# Patient Record
Sex: Male | Born: 1935 | Race: White | Hispanic: No | Marital: Married | Smoking: Never smoker
Health system: Southern US, Community
[De-identification: ages and names within clinical notes are randomized; demographics above are authoritative.]

## PROBLEM LIST (undated history)

## (undated) DIAGNOSIS — E7800 Pure hypercholesterolemia, unspecified: Secondary | ICD-10-CM

## (undated) DIAGNOSIS — I080 Rheumatic disorders of both mitral and aortic valves: Secondary | ICD-10-CM

## (undated) DIAGNOSIS — K432 Incisional hernia without obstruction or gangrene: Secondary | ICD-10-CM

## (undated) DIAGNOSIS — I82409 Acute embolism and thrombosis of unspecified deep veins of unspecified lower extremity: Secondary | ICD-10-CM

## (undated) DIAGNOSIS — Q6100 Congenital renal cyst, unspecified: Secondary | ICD-10-CM

## (undated) DIAGNOSIS — C159 Malignant neoplasm of esophagus, unspecified: Secondary | ICD-10-CM

## (undated) DIAGNOSIS — F0390 Unspecified dementia without behavioral disturbance: Secondary | ICD-10-CM

## (undated) HISTORY — PX: REVISION TOTAL KNEE ARTHROPLASTY: SHX767

## (undated) HISTORY — DX: Malignant neoplasm of esophagus, unspecified: C15.9

## (undated) HISTORY — PX: HIP FRACTURE SURGERY: SHX118

## (undated) HISTORY — DX: Acute embolism and thrombosis of unspecified deep veins of unspecified lower extremity: I82.409

## (undated) HISTORY — DX: Rheumatic disorders of both mitral and aortic valves: I08.0

## (undated) HISTORY — DX: Pure hypercholesterolemia, unspecified: E78.00

## (undated) HISTORY — DX: Congenital renal cyst, unspecified: Q61.00

## (undated) HISTORY — DX: Unspecified dementia, unspecified severity, without behavioral disturbance, psychotic disturbance, mood disturbance, and anxiety: F03.90

## (undated) HISTORY — DX: Incisional hernia without obstruction or gangrene: K43.2

---

## 2006-12-19 ENCOUNTER — Observation Stay: Payer: Self-pay | Admitting: Internal Medicine

## 2006-12-19 ENCOUNTER — Other Ambulatory Visit: Payer: Self-pay

## 2007-01-11 ENCOUNTER — Encounter: Payer: Self-pay | Admitting: Internal Medicine

## 2007-01-20 ENCOUNTER — Encounter: Payer: Self-pay | Admitting: Internal Medicine

## 2007-02-18 ENCOUNTER — Encounter: Payer: Self-pay | Admitting: Internal Medicine

## 2007-03-21 ENCOUNTER — Encounter: Payer: Self-pay | Admitting: Internal Medicine

## 2007-04-20 ENCOUNTER — Encounter: Payer: Self-pay | Admitting: Internal Medicine

## 2007-05-21 ENCOUNTER — Encounter: Payer: Self-pay | Admitting: Internal Medicine

## 2008-09-12 ENCOUNTER — Ambulatory Visit: Payer: Self-pay | Admitting: Cardiovascular Disease

## 2008-09-12 ENCOUNTER — Other Ambulatory Visit: Payer: Self-pay

## 2008-09-12 ENCOUNTER — Ambulatory Visit: Payer: Self-pay | Admitting: Urology

## 2008-09-24 ENCOUNTER — Ambulatory Visit: Payer: Self-pay | Admitting: Urology

## 2008-12-22 ENCOUNTER — Emergency Department: Payer: Self-pay | Admitting: Emergency Medicine

## 2009-06-17 ENCOUNTER — Ambulatory Visit: Payer: Self-pay | Admitting: Urology

## 2009-09-19 ENCOUNTER — Ambulatory Visit: Payer: Self-pay | Admitting: Internal Medicine

## 2009-09-22 ENCOUNTER — Ambulatory Visit: Payer: Self-pay | Admitting: Internal Medicine

## 2009-10-20 ENCOUNTER — Ambulatory Visit: Payer: Self-pay | Admitting: Internal Medicine

## 2009-11-03 ENCOUNTER — Ambulatory Visit: Payer: Self-pay | Admitting: Orthopedic Surgery

## 2009-11-10 ENCOUNTER — Ambulatory Visit: Payer: Self-pay | Admitting: Vascular Surgery

## 2009-11-17 ENCOUNTER — Inpatient Hospital Stay: Payer: Self-pay | Admitting: Orthopedic Surgery

## 2009-11-21 ENCOUNTER — Encounter: Payer: Self-pay | Admitting: Internal Medicine

## 2009-12-09 ENCOUNTER — Encounter: Payer: Self-pay | Admitting: Orthopedic Surgery

## 2009-12-20 ENCOUNTER — Encounter: Payer: Self-pay | Admitting: Orthopedic Surgery

## 2010-01-14 ENCOUNTER — Ambulatory Visit: Payer: Self-pay | Admitting: Vascular Surgery

## 2010-01-20 ENCOUNTER — Encounter: Payer: Self-pay | Admitting: Orthopedic Surgery

## 2010-05-15 ENCOUNTER — Ambulatory Visit: Payer: Self-pay | Admitting: Orthopedic Surgery

## 2010-09-07 ENCOUNTER — Inpatient Hospital Stay: Payer: Self-pay | Admitting: Internal Medicine

## 2010-09-18 ENCOUNTER — Ambulatory Visit: Payer: Self-pay | Admitting: Cardiovascular Disease

## 2011-01-01 ENCOUNTER — Ambulatory Visit: Payer: Self-pay | Admitting: Nephrology

## 2011-02-16 ENCOUNTER — Ambulatory Visit: Payer: Self-pay | Admitting: Internal Medicine

## 2011-02-18 ENCOUNTER — Ambulatory Visit: Payer: Self-pay | Admitting: Internal Medicine

## 2011-03-21 ENCOUNTER — Ambulatory Visit: Payer: Self-pay | Admitting: Internal Medicine

## 2011-08-24 ENCOUNTER — Ambulatory Visit: Payer: Self-pay | Admitting: Internal Medicine

## 2011-09-14 ENCOUNTER — Ambulatory Visit: Payer: Self-pay | Admitting: Urology

## 2011-09-20 ENCOUNTER — Ambulatory Visit: Payer: Self-pay | Admitting: Internal Medicine

## 2012-01-27 ENCOUNTER — Inpatient Hospital Stay: Payer: Self-pay | Admitting: Orthopedic Surgery

## 2012-01-27 LAB — URINALYSIS, COMPLETE
Bilirubin,UR: NEGATIVE
Glucose,UR: 50 mg/dL (ref 0–75)
Leukocyte Esterase: NEGATIVE
Nitrite: NEGATIVE
Ph: 5 (ref 4.5–8.0)
RBC,UR: 2 /HPF (ref 0–5)
Squamous Epithelial: <1

## 2012-01-27 LAB — COMPREHENSIVE METABOLIC PANEL
Anion Gap: 10 (ref 7–16)
BUN: 34 mg/dL — ABNORMAL HIGH (ref 7–18)
Bilirubin,Total: 0.5 mg/dL (ref 0.2–1.0)
Chloride: 108 mmol/L — ABNORMAL HIGH (ref 98–107)
Co2: 25 mmol/L (ref 21–32)
Creatinine: 2.28 mg/dL — ABNORMAL HIGH (ref 0.60–1.30)
EGFR (African American): 36 — ABNORMAL LOW
EGFR (Non-African Amer.): 29 — ABNORMAL LOW
Osmolality: 295 (ref 275–301)
Potassium: 4 mmol/L (ref 3.5–5.1)
SGOT(AST): 22 U/L (ref 15–37)
Sodium: 143 mmol/L (ref 136–145)
Total Protein: 7.1 g/dL (ref 6.4–8.2)

## 2012-01-27 LAB — CBC
HCT: 42.9 % (ref 40.0–52.0)
HGB: 14.2 g/dL (ref 13.0–18.0)
MCV: 94 fL (ref 80–100)
RBC: 4.55 10*6/uL (ref 4.40–5.90)
WBC: 13.2 10*3/uL — ABNORMAL HIGH (ref 3.8–10.6)

## 2012-01-28 LAB — APTT: Activated PTT: 34.3 secs (ref 23.6–35.9)

## 2012-01-28 LAB — PROTIME-INR: INR: 1.1

## 2012-01-29 LAB — BASIC METABOLIC PANEL
BUN: 25 mg/dL — ABNORMAL HIGH (ref 7–18)
Chloride: 110 mmol/L — ABNORMAL HIGH (ref 98–107)
Creatinine: 2.17 mg/dL — ABNORMAL HIGH (ref 0.60–1.30)
EGFR (Non-African Amer.): 31 — ABNORMAL LOW
Glucose: 165 mg/dL — ABNORMAL HIGH (ref 65–99)
Potassium: 4.1 mmol/L (ref 3.5–5.1)
Sodium: 142 mmol/L (ref 136–145)

## 2012-01-29 LAB — PLATELET COUNT: Platelet: 101 10*3/uL — ABNORMAL LOW (ref 150–440)

## 2012-01-29 LAB — HEMOGLOBIN: HGB: 11 g/dL — ABNORMAL LOW (ref 13.0–18.0)

## 2012-01-29 LAB — PROTIME-INR
INR: 1.2
Prothrombin Time: 16 secs — ABNORMAL HIGH (ref 11.5–14.7)

## 2012-01-30 LAB — CBC WITH DIFFERENTIAL/PLATELET
Basophil %: 0.2 %
Eosinophil #: 0.5 10*3/uL (ref 0.0–0.7)
HCT: 31.3 % — ABNORMAL LOW (ref 40.0–52.0)
HGB: 10.4 g/dL — ABNORMAL LOW (ref 13.0–18.0)
Lymphocyte #: 0.8 10*3/uL — ABNORMAL LOW (ref 1.0–3.6)
MCH: 31.3 pg (ref 26.0–34.0)
MCHC: 33.2 g/dL (ref 32.0–36.0)
MCV: 94 fL (ref 80–100)
Monocyte #: 1 10*3/uL — ABNORMAL HIGH (ref 0.0–0.7)
Neutrophil #: 6.2 10*3/uL (ref 1.4–6.5)
Neutrophil %: 72.8 %
RDW: 14.3 % (ref 11.5–14.5)

## 2012-01-30 LAB — PROTIME-INR
INR: 1.8
Prothrombin Time: 21.6 secs — ABNORMAL HIGH (ref 11.5–14.7)

## 2012-01-30 LAB — CREATININE, SERUM: EGFR (Non-African Amer.): 29 — ABNORMAL LOW

## 2012-01-31 ENCOUNTER — Encounter: Payer: Self-pay | Admitting: Internal Medicine

## 2012-01-31 LAB — PROTIME-INR: INR: 1.6

## 2012-02-01 LAB — PROTIME-INR
INR: 1.6
Prothrombin Time: 19 secs — ABNORMAL HIGH (ref 11.5–14.7)

## 2012-02-01 LAB — PATHOLOGY REPORT

## 2012-02-03 LAB — PROTIME-INR: INR: 1.4

## 2012-02-10 LAB — PROTIME-INR
INR: 1.2
Prothrombin Time: 15.6 secs — ABNORMAL HIGH (ref 11.5–14.7)

## 2012-02-10 LAB — URINALYSIS, COMPLETE
Bilirubin,UR: NEGATIVE
Glucose,UR: 50 mg/dL (ref 0–75)
Ketone: NEGATIVE
Leukocyte Esterase: NEGATIVE
Ph: 5 (ref 4.5–8.0)
Squamous Epithelial: <1
WBC UR: 3 /HPF (ref 0–5)

## 2012-02-12 LAB — URINE CULTURE

## 2012-02-17 LAB — PROTIME-INR
INR: 1.7
Prothrombin Time: 20.7 secs — ABNORMAL HIGH (ref 11.5–14.7)

## 2012-02-18 ENCOUNTER — Encounter: Payer: Self-pay | Admitting: Internal Medicine

## 2012-02-22 ENCOUNTER — Encounter: Payer: Self-pay | Admitting: Internal Medicine

## 2012-02-22 LAB — PROTIME-INR: INR: 2.3

## 2012-03-02 ENCOUNTER — Inpatient Hospital Stay: Payer: Self-pay | Admitting: General Practice

## 2012-03-02 LAB — CBC
HGB: 12.5 g/dL — ABNORMAL LOW (ref 13.0–18.0)
MCH: 30.3 pg (ref 26.0–34.0)
MCHC: 32.7 g/dL (ref 32.0–36.0)
MCV: 93 fL (ref 80–100)
RBC: 4.12 10*6/uL — ABNORMAL LOW (ref 4.40–5.90)
WBC: 11.9 10*3/uL — ABNORMAL HIGH (ref 3.8–10.6)

## 2012-03-02 LAB — COMPREHENSIVE METABOLIC PANEL
Albumin: 3.7 g/dL (ref 3.4–5.0)
BUN: 42 mg/dL — ABNORMAL HIGH (ref 7–18)
Bilirubin,Total: 0.3 mg/dL (ref 0.2–1.0)
Chloride: 106 mmol/L (ref 98–107)
EGFR (African American): 36 — ABNORMAL LOW
Glucose: 160 mg/dL — ABNORMAL HIGH (ref 65–99)
Potassium: 4.6 mmol/L (ref 3.5–5.1)
SGPT (ALT): 18 U/L
Total Protein: 7.6 g/dL (ref 6.4–8.2)

## 2012-03-03 LAB — URINALYSIS, COMPLETE
Bacteria: NONE SEEN
Bilirubin,UR: NEGATIVE
Leukocyte Esterase: NEGATIVE
Nitrite: NEGATIVE
Protein: NEGATIVE
RBC,UR: 1 /HPF (ref 0–5)
Specific Gravity: 1.015 (ref 1.003–1.030)
Squamous Epithelial: <1
WBC UR: 2 /HPF (ref 0–5)

## 2012-03-03 LAB — PROTIME-INR
INR: 1.9
INR: 1.3
Prothrombin Time: 21.9 secs — ABNORMAL HIGH (ref 11.5–14.7)
Prothrombin Time: 16.4 secs — ABNORMAL HIGH (ref 11.5–14.7)

## 2012-03-04 LAB — BASIC METABOLIC PANEL
Anion Gap: 6 — ABNORMAL LOW (ref 7–16)
BUN: 34 mg/dL — ABNORMAL HIGH (ref 7–18)
Chloride: 110 mmol/L — ABNORMAL HIGH (ref 98–107)
Co2: 22 mmol/L (ref 21–32)
Creatinine: 2.1 mg/dL — ABNORMAL HIGH (ref 0.60–1.30)
Potassium: 4.2 mmol/L (ref 3.5–5.1)
Sodium: 138 mmol/L (ref 136–145)

## 2012-03-04 LAB — HEMOGLOBIN: HGB: 11.6 g/dL — ABNORMAL LOW (ref 13.0–18.0)

## 2012-03-05 LAB — HEMOGLOBIN: HGB: 8.7 g/dL — ABNORMAL LOW (ref 13.0–18.0)

## 2012-03-05 LAB — PLATELET COUNT: Platelet: 139 10*3/uL — ABNORMAL LOW (ref 150–440)

## 2012-03-05 LAB — BASIC METABOLIC PANEL
Anion Gap: 6 — ABNORMAL LOW (ref 7–16)
BUN: 33 mg/dL — ABNORMAL HIGH (ref 7–18)
Chloride: 111 mmol/L — ABNORMAL HIGH (ref 98–107)
Co2: 21 mmol/L (ref 21–32)
Creatinine: 2.14 mg/dL — ABNORMAL HIGH (ref 0.60–1.30)
EGFR (African American): 38 — ABNORMAL LOW
Osmolality: 284 (ref 275–301)
Potassium: 4.2 mmol/L (ref 3.5–5.1)

## 2012-03-05 LAB — PROTIME-INR: INR: 1.2

## 2012-03-06 LAB — BASIC METABOLIC PANEL
Anion Gap: 7 (ref 7–16)
Calcium, Total: 8.4 mg/dL — ABNORMAL LOW (ref 8.5–10.1)
Creatinine: 2.03 mg/dL — ABNORMAL HIGH (ref 0.60–1.30)
EGFR (African American): 41 — ABNORMAL LOW
EGFR (Non-African Amer.): 34 — ABNORMAL LOW
Sodium: 140 mmol/L (ref 136–145)

## 2012-03-06 LAB — PROTIME-INR
INR: 1.1
Prothrombin Time: 14.8 secs — ABNORMAL HIGH (ref 11.5–14.7)

## 2012-03-07 LAB — PATHOLOGY REPORT

## 2012-03-20 ENCOUNTER — Encounter: Payer: Self-pay | Admitting: Internal Medicine

## 2012-04-03 LAB — RENAL FUNCTION PANEL
Albumin: 3.3 g/dL — ABNORMAL LOW (ref 3.4–5.0)
Calcium, Total: 9.1 mg/dL (ref 8.5–10.1)
EGFR (African American): 28 — ABNORMAL LOW
EGFR (Non-African Amer.): 24 — ABNORMAL LOW
Phosphorus: 4.3 mg/dL (ref 2.5–4.9)
Sodium: 142 mmol/L (ref 136–145)

## 2012-04-04 LAB — PROTEIN / CREATININE RATIO, URINE: Creatinine, Urine: 67.9 mg/dL (ref 30.0–125.0)

## 2012-04-19 ENCOUNTER — Encounter: Payer: Self-pay | Admitting: Internal Medicine

## 2012-06-29 ENCOUNTER — Encounter: Payer: Self-pay | Admitting: Internal Medicine

## 2012-07-19 ENCOUNTER — Ambulatory Visit: Payer: Self-pay | Admitting: Urology

## 2012-07-20 ENCOUNTER — Encounter: Payer: Self-pay | Admitting: Internal Medicine

## 2012-08-20 ENCOUNTER — Encounter: Payer: Self-pay | Admitting: Internal Medicine

## 2012-08-28 ENCOUNTER — Encounter: Payer: Self-pay | Admitting: Cardiovascular Disease

## 2012-08-28 ENCOUNTER — Ambulatory Visit (INDEPENDENT_AMBULATORY_CARE_PROVIDER_SITE_OTHER): Payer: Medicare Other | Admitting: Cardiovascular Disease

## 2012-08-28 VITALS — BP 110/72 | HR 78 | Ht 67.0 in | Wt 173.8 lb

## 2012-08-28 DIAGNOSIS — I10 Essential (primary) hypertension: Secondary | ICD-10-CM

## 2012-08-28 DIAGNOSIS — R351 Nocturia: Secondary | ICD-10-CM

## 2012-08-28 DIAGNOSIS — Z952 Presence of prosthetic heart valve: Secondary | ICD-10-CM | POA: Insufficient documentation

## 2012-08-28 DIAGNOSIS — F0390 Unspecified dementia without behavioral disturbance: Secondary | ICD-10-CM

## 2012-08-28 DIAGNOSIS — Z954 Presence of other heart-valve replacement: Secondary | ICD-10-CM

## 2012-08-28 DIAGNOSIS — I359 Nonrheumatic aortic valve disorder, unspecified: Secondary | ICD-10-CM

## 2012-08-28 NOTE — Assessment & Plan Note (Signed)
I did talk with his wife about caretaker fatigue. She is trying to manage all of his symptoms at home and does not have any significant support. They do have some family in town but she recently underwent surgery.

## 2012-08-28 NOTE — Assessment & Plan Note (Signed)
His wife reports significant nocturia out of proportion to his daytime urination. I've asked her to watch this closely. This is worse following recent anesthesia. 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 Frequently, a single dose of desmopressin given before bed may help decrease his urine output through the evening. His medication will wear off by the morning and he will resume his usual urination in the daytime. I have asked that they talk with Dr. Randa Lynn and Dr. Achilles Dunk to determine if nocturnal urination volume is excessive and if he may benefit from this. To avoid skin breakdown, he may also benefit from condom catheter.

## 2012-08-28 NOTE — Progress Notes (Signed)
Patient ID: Philip Ray, male    DOB: 10/19/1926, 78 y.o.   MRN: [id number]  HPI Comments: Mr. Philip Ray is a pleasant 78 year old gentleman with dementia followed by Dr. Achilles Dunk and Dr. Randa Lynn with history of colon cancer, esophageal cancer and surgery in 2002, recent falls with right hip replacement, repeat fall while at nursing facility with left hip replacement now living at the [facility] [place]. He has a history of bioprosthetic aortic valve placed for aortic valve stenosis.  His wife reports that through his recent falls and hip fractures, he has had significant weight loss. He has also had significant progression of his dementia. After his surgeries, it seems that he has significant nocturia and inability to control himself. He does not wake up when he goes to the bathroom and lysed through the night in a wet diaper. He also requires a diaper pad during the daytime. He walks with a walker. He tries to do some exercise but is not doing so a regular basis. He was recently started on a new medication by Dr. Achilles Dunk to increase the sensation to allow him to control his urination. He does not seem that this has worked as hoped. He has no complaints but is a poor historian.  His wife reports that there was no coronary artery disease on previous cardiac catheterization prior to valve replacement. He also had no significant coronary artery disease. Most of his evaluation was at [facility].  EKG shows normal sinus rhythm with rate 57 beats per minute, no significant ST or T wave changes   Outpatient Encounter Prescriptions as of 08/28/2012  Medication Sig Dispense Refill  . amLODipine (NORVASC) 2.5 MG tablet Take 2.5 mg by mouth daily.      Marland Kitchen aspirin 81 MG tablet Take 81 mg by mouth daily.      . Cholecalciferol (VITAMIN D) 2000 UNITS tablet Take 2,000 Units by mouth daily.      . citalopram (CELEXA) 20 MG tablet Take 20 mg by mouth daily.      . Coenzyme Q-10 100 MG capsule Take 100 mg by mouth daily.      Marland Kitchen  donepezil (ARICEPT) 10 MG tablet Take 10 mg by mouth at bedtime.      . finasteride (PROSCAR) 5 MG tablet Takes 3 tablets per week.      . losartan (COZAAR) 100 MG tablet Take 100 mg by mouth daily.      . Memantine HCl (NAMENDA PO) Take 28 mg by mouth daily.      . Multiple Minerals-Vitamins (CALCIUM CITRATE PLUS/MAGNESIUM PO) Take by mouth daily.      . Multiple Vitamin (MULTIVITAMIN) tablet Take 1 tablet by mouth daily.      . NON FORMULARY myrbetic 25 mg daily      . Omega-3 Fatty Acids (OMEGA-3 FISH OIL PO) Take 200 mg by mouth daily.      Marland Kitchen omeprazole (PRILOSEC) 20 MG capsule Takes 2 tablets daily.        Review of Systems  Constitutional: Negative.   HENT: Negative.   Eyes: Negative.   Respiratory: Negative.   Cardiovascular: Negative.   Gastrointestinal: Negative.   Genitourinary: Positive for frequency and difficulty urinating.  Musculoskeletal: Negative.   Skin: Negative.   Neurological: Negative.   Hematological: Negative.   Psychiatric/Behavioral: Negative.   All other systems reviewed and are negative.    BP 110/72  Pulse 78  Ht 5\' 7"  (1.702 m)  Wt 173 lb 12 oz (78.812 kg)  BMI 27.21 kg/m2  Physical Exam  Nursing note and vitals reviewed. Constitutional: He is oriented to person, place, and time. He appears well-developed and well-nourished.  HENT:  Head: Normocephalic.  Nose: Nose normal.  Mouth/Throat: Oropharynx is clear and moist.  Eyes: Conjunctivae are normal. Pupils are equal, round, and reactive to light.  Neck: Normal range of motion. Neck supple. No JVD present.  Cardiovascular: Normal rate, regular rhythm, S1 normal, S2 normal, normal heart sounds and intact distal pulses.  Exam reveals no gallop and no friction rub.   No murmur heard. Pulmonary/Chest: Effort normal and breath sounds normal. No respiratory distress. He has no wheezes. He has no rales. He exhibits no tenderness.  Abdominal: Soft. Bowel sounds are normal. He exhibits no distension.  There is no tenderness.  Musculoskeletal: Normal range of motion. He exhibits no edema and no tenderness.  Lymphadenopathy:    He has no cervical adenopathy.  Neurological: He is alert and oriented to person, place, and time. Coordination normal.  Skin: Skin is warm and dry. No rash noted. No erythema.  Psychiatric: He has a normal mood and affect. His behavior is normal. Judgment and thought content normal.           Assessment and Plan

## 2012-08-28 NOTE — Patient Instructions (Addendum)
You are doing well. No medication changes were made.  Please call us if you have new issues that need to be addressed before your next appt.  Your physician wants you to follow-up in: 6 months.  You will receive a reminder letter in the mail two months in advance. If you don't receive a letter, please call our office to schedule the follow-up appointment.   

## 2012-08-28 NOTE — Assessment & Plan Note (Signed)
Clinical exam appears relatively benign. No signs of heart failure. We will try to obtain the records from [facility] for repeat testing. Typically echocardiogram once a year to evaluate his bioprosthetic valve.

## 2012-10-20 ENCOUNTER — Ambulatory Visit: Payer: Self-pay | Admitting: Internal Medicine

## 2012-10-27 ENCOUNTER — Ambulatory Visit: Payer: Self-pay | Admitting: Internal Medicine

## 2012-11-08 ENCOUNTER — Ambulatory Visit: Payer: Self-pay | Admitting: Oncology

## 2012-11-19 ENCOUNTER — Ambulatory Visit: Payer: Self-pay | Admitting: Oncology

## 2012-11-19 ENCOUNTER — Ambulatory Visit: Payer: Self-pay | Admitting: Internal Medicine

## 2012-11-21 LAB — OCCULT BLOOD X 1 CARD TO LAB, STOOL: Occult Blood, Feces: NEGATIVE

## 2012-11-27 ENCOUNTER — Ambulatory Visit: Payer: Self-pay | Admitting: Neurology

## 2012-12-20 ENCOUNTER — Ambulatory Visit: Payer: Self-pay | Admitting: Internal Medicine

## 2013-01-05 LAB — CBC CANCER CENTER
Basophil #: 0.1 x10 3/mm (ref 0.0–0.1)
Basophil %: 1.1 %
Eosinophil %: 3.7 %
HCT: 40 % (ref 40.0–52.0)
Lymphocyte #: 1.9 x10 3/mm (ref 1.0–3.6)
MCH: 29.2 pg (ref 26.0–34.0)
MCHC: 33.2 g/dL (ref 32.0–36.0)
Monocyte %: 8.5 %
Neutrophil %: 62.8 %
Platelet: 168 x10 3/mm (ref 150–440)
RBC: 4.55 10*6/uL (ref 4.40–5.90)
RDW: 18.8 % — ABNORMAL HIGH (ref 11.5–14.5)
WBC: 7.8 x10 3/mm (ref 3.8–10.6)

## 2013-01-05 LAB — IRON AND TIBC
Iron Saturation: 31 %
Iron: 96 ug/dL (ref 65–175)
Unbound Iron-Bind.Cap.: 218 ug/dL

## 2013-01-05 LAB — CREATININE, SERUM
Creatinine: 2.73 mg/dL — ABNORMAL HIGH (ref 0.60–1.30)
EGFR (African American): 24 — ABNORMAL LOW
EGFR (Non-African Amer.): 20 — ABNORMAL LOW

## 2013-01-05 LAB — RETICULOCYTES
Absolute Retic Count: 0.055 10*6/uL (ref 0.031–0.129)
Reticulocyte: 1.21 % (ref 0.7–2.5)

## 2013-01-05 LAB — CALCIUM: Calcium, Total: 8.7 mg/dL (ref 8.5–10.1)

## 2013-01-20 ENCOUNTER — Ambulatory Visit: Payer: Self-pay | Admitting: Internal Medicine

## 2013-03-01 ENCOUNTER — Ambulatory Visit (INDEPENDENT_AMBULATORY_CARE_PROVIDER_SITE_OTHER): Payer: Medicare Other | Admitting: Cardiovascular Disease

## 2013-03-01 ENCOUNTER — Encounter: Payer: Self-pay | Admitting: Cardiovascular Disease

## 2013-03-01 VITALS — BP 118/80 | HR 73 | Ht 67.0 in | Wt 182.8 lb

## 2013-03-01 DIAGNOSIS — F0390 Unspecified dementia without behavioral disturbance: Secondary | ICD-10-CM

## 2013-03-01 DIAGNOSIS — Z952 Presence of prosthetic heart valve: Secondary | ICD-10-CM

## 2013-03-01 DIAGNOSIS — R351 Nocturia: Secondary | ICD-10-CM

## 2013-03-01 DIAGNOSIS — I10 Essential (primary) hypertension: Secondary | ICD-10-CM

## 2013-03-01 DIAGNOSIS — Z954 Presence of other heart-valve replacement: Secondary | ICD-10-CM

## 2013-03-01 NOTE — Patient Instructions (Addendum)
You are doing well. No medication changes were made.  We will schedule an echocardiogram for prosthetic aortic valve  Please call us if you have new issues that need to be addressed before your next appt.  Your physician wants you to follow-up in: 6 months.  You will receive a reminder letter in the mail two months in advance. If you don't receive a letter, please call our office to schedule the follow-up appointment.

## 2013-03-01 NOTE — Assessment & Plan Note (Signed)
Not much time discussing nocturia on this visit. Uncertain if he would benefit from desmopressin before bed

## 2013-03-01 NOTE — Assessment & Plan Note (Signed)
Echocardiogram has been ordered to evaluate his aortic valve

## 2013-03-01 NOTE — Assessment & Plan Note (Signed)
He is requiring more and more care. [facility].

## 2013-03-01 NOTE — Assessment & Plan Note (Signed)
Blood pressure is well controlled on today's visit. No changes made to the medications. 

## 2013-03-01 NOTE — Progress Notes (Signed)
Patient ID: Philip Ray, male    DOB: 19-May-1926, 79 y.o.   MRN: [id number]  HPI Comments: Philip Ray is a pleasant 79 year old gentleman with dementia followed by Dr. Achilles Dunk and Dr. Randa Lynn with history of colon cancer, esophageal cancer and surgery in 2002, recent falls with right hip replacement, repeat fall while at nursing facility with left hip replacement now living at the [facility] [place]. He has a history of bioprosthetic aortic valve placed for aortic valve stenosis.  Previous  falls and hip fractures, previous weight loss. No recent trauma.   significant progression of his dementia. After his surgeries, it seems that he has significant nocturia and inability to control himself.  He also requires a diaper pad during the daytime. He walks with a walker. He tries to do some exercise but is not doing so a regular basis.  He has no complaints but is a poor historian. His wife is getting tired and is taking care of him full-time  His wife reports that there was no coronary artery disease on previous cardiac catheterization prior to valve replacement. He also had no significant coronary artery disease. Most of his evaluation was at [facility]. Last echocardiogram 4 years ago  EKG shows normal sinus rhythm with rate 73 beats per minute, right bundle branch block ,no significant ST or T wave changes   Outpatient Encounter Prescriptions as of 03/01/2013  Medication Sig Dispense Refill  . amLODipine (NORVASC) 2.5 MG tablet Take 2.5 mg by mouth daily.      Marland Kitchen aspirin 81 MG tablet Take 81 mg by mouth daily.      . Cholecalciferol (VITAMIN D) 2000 UNITS tablet Take 2,000 Units by mouth daily.      . citalopram (CELEXA) 20 MG tablet Take 20 mg by mouth daily.      . Coenzyme Q-10 100 MG capsule Take 100 mg by mouth daily.      Marland Kitchen donepezil (ARICEPT) 10 MG tablet Take 10 mg by mouth at bedtime.      . finasteride (PROSCAR) 5 MG tablet Takes 3 tablets per week.      . losartan (COZAAR) 100 MG tablet Take  100 mg by mouth daily.      . Multiple Minerals-Vitamins (CALCIUM CITRATE PLUS/MAGNESIUM PO) Take by mouth daily.      . Multiple Vitamin (MULTIVITAMIN) tablet Take 1 tablet by mouth daily.      . Omega-3 Fatty Acids (OMEGA-3 FISH OIL PO) Take 200 mg by mouth daily.      Marland Kitchen omeprazole (PRILOSEC) 20 MG capsule Takes 2 tablets daily.      . [DISCONTINUED] Memantine HCl (NAMENDA PO) Take 28 mg by mouth daily.      . [DISCONTINUED] NON FORMULARY myrbetic 25 mg daily       No facility-administered encounter medications on file as of 03/01/2013.    Review of Systems  Constitutional: Negative.   HENT: Negative.   Eyes: Negative.   Respiratory: Negative.   Cardiovascular: Negative.   Gastrointestinal: Negative.   Genitourinary: Positive for frequency and difficulty urinating.  Musculoskeletal: Negative.   Skin: Negative.   Neurological: Negative.   Psychiatric/Behavioral: Negative.   All other systems reviewed and are negative.    BP 118/80  Pulse 73  Ht 5\' 7"  (1.702 m)  Wt 182 lb 12 oz (82.895 kg)  BMI 28.62 kg/m2  Physical Exam  Nursing note and vitals reviewed. Constitutional: He is oriented to person, place, and time. He appears well-developed and well-nourished.  HENT:  Head: Normocephalic.  Nose: Nose normal.  Mouth/Throat: Oropharynx is clear and moist.  Eyes: Conjunctivae are normal. Pupils are equal, round, and reactive to light.  Neck: Normal range of motion. Neck supple. No JVD present.  Cardiovascular: Normal rate, regular rhythm, S1 normal, S2 normal, normal heart sounds and intact distal pulses.  Exam reveals no gallop and no friction rub.   No murmur heard. Pulmonary/Chest: Effort normal and breath sounds normal. No respiratory distress. He has no wheezes. He has no rales. He exhibits no tenderness.  Abdominal: Soft. Bowel sounds are normal. He exhibits no distension. There is no tenderness.  Musculoskeletal: Normal range of motion. He exhibits no edema and no  tenderness.  Lymphadenopathy:    He has no cervical adenopathy.  Neurological: He is alert and oriented to person, place, and time. Coordination normal.  Skin: Skin is warm and dry. No rash noted. No erythema.  Psychiatric: He has a normal mood and affect. His behavior is normal. Judgment and thought content normal.      Assessment and Plan

## 2013-03-02 ENCOUNTER — Other Ambulatory Visit (INDEPENDENT_AMBULATORY_CARE_PROVIDER_SITE_OTHER): Payer: Medicare Other

## 2013-03-02 ENCOUNTER — Other Ambulatory Visit: Payer: Self-pay

## 2013-03-02 DIAGNOSIS — I359 Nonrheumatic aortic valve disorder, unspecified: Secondary | ICD-10-CM

## 2013-03-02 DIAGNOSIS — R0989 Other specified symptoms and signs involving the circulatory and respiratory systems: Secondary | ICD-10-CM

## 2013-03-02 DIAGNOSIS — Z952 Presence of prosthetic heart valve: Secondary | ICD-10-CM

## 2013-09-04 ENCOUNTER — Ambulatory Visit (INDEPENDENT_AMBULATORY_CARE_PROVIDER_SITE_OTHER): Payer: Medicare Other | Admitting: Cardiovascular Disease

## 2013-09-04 ENCOUNTER — Encounter: Payer: Self-pay | Admitting: Cardiovascular Disease

## 2013-09-04 VITALS — BP 120/82 | HR 78 | Ht 69.0 in | Wt 184.8 lb

## 2013-09-04 DIAGNOSIS — Z952 Presence of prosthetic heart valve: Secondary | ICD-10-CM

## 2013-09-04 DIAGNOSIS — F0390 Unspecified dementia without behavioral disturbance: Secondary | ICD-10-CM

## 2013-09-04 DIAGNOSIS — Z954 Presence of other heart-valve replacement: Secondary | ICD-10-CM

## 2013-09-04 DIAGNOSIS — I10 Essential (primary) hypertension: Secondary | ICD-10-CM

## 2013-09-04 NOTE — Assessment & Plan Note (Signed)
Blood pressure is well controlled on today's visit. No changes made to the medications. 

## 2013-09-04 NOTE — Progress Notes (Signed)
Patient ID: Philip Ray, male    DOB: [birth year], 79 y.o.   MRN: [id number]  HPI Comments: Philip Ray is a pleasant 79 year old gentleman with dementia, history of colon cancer, esophageal cancer and surgery in 2002, recent falls with right hip replacement, repeat fall while at nursing facility with left hip replacement now living at the [facility] [place]. He has a history of bioprosthetic aortic valve placed for aortic valve stenosis. Left leg DVT in 2007  Overall he has been stable. Continues to have significant underlying dementia. His wife takes care of him and does most of his ADLs. Recently started on low-dose Sinemet for tremor under the guidance of Dr. Malvin Johns, neurology. Wife does not think this has helped and is afraid of the side effects. He has had more confusion. Occasional falls, no trauma.  He tries to do some exercise but is not doing so a regular basis.  He has no complaints but is a poor historian. His wife is getting tired and is taking care of him full-time  His wife reports that there was no coronary artery disease on previous cardiac catheterization prior to valve replacement. He also had no significant coronary artery disease. Most of his evaluation was at [facility].  EKG shows normal sinus rhythm with rate 78 beats per minute, right bundle branch block ,no significant ST or T wave changes   Outpatient Encounter Prescriptions as of 09/04/2013  Medication Sig Dispense Refill  . amLODipine (NORVASC) 2.5 MG tablet Take 2.5 mg by mouth daily.      Marland Kitchen aspirin 81 MG tablet Take 81 mg by mouth daily.      . Cholecalciferol (VITAMIN D) 2000 UNITS tablet Take 2,000 Units by mouth daily.      . citalopram (CELEXA) 20 MG tablet Take 20 mg by mouth daily.      . Coenzyme Q-10 100 MG capsule Take 100 mg by mouth daily.      Marland Kitchen donepezil (ARICEPT) 10 MG tablet Take 10 mg by mouth at bedtime.      . finasteride (PROSCAR) 5 MG tablet Takes 3 tablets per week.      . losartan (COZAAR) 100  MG tablet Take 100 mg by mouth daily.      . Multiple Minerals-Vitamins (CALCIUM CITRATE PLUS/MAGNESIUM PO) Take by mouth daily.      . Multiple Vitamin (MULTIVITAMIN) tablet Take 1 tablet by mouth daily.      . Omega-3 Fatty Acids (OMEGA-3 FISH OIL PO) Take 200 mg by mouth daily.      Marland Kitchen omeprazole (PRILOSEC) 20 MG capsule Takes 2 tablets daily.       No facility-administered encounter medications on file as of 09/04/2013.    Review of Systems  Constitutional: Negative.   HENT: Negative.   Eyes: Negative.   Respiratory: Negative.   Cardiovascular: Negative.   Gastrointestinal: Negative.   Genitourinary: Positive for frequency and difficulty urinating.  Musculoskeletal: Negative.   Skin: Negative.   Neurological: Negative.   Psychiatric/Behavioral: Negative.   All other systems reviewed and are negative.    BP 120/82  Ht 5\' 9"  (1.753 m)  Wt 184 lb 12 oz (83.802 kg)  BMI 27.27 kg/m2  Physical Exam  Nursing note and vitals reviewed. Constitutional: He is oriented to person, place, and time. He appears well-developed and well-nourished.  HENT:  Head: Normocephalic.  Nose: Nose normal.  Mouth/Throat: Oropharynx is clear and moist.  Eyes: Conjunctivae are normal. Pupils are equal, round, and reactive to light.  Neck: Normal range of motion. Neck supple. No JVD present.  Cardiovascular: Normal rate, regular rhythm, S1 normal, S2 normal, normal heart sounds and intact distal pulses.  Exam reveals no gallop and no friction rub.   No murmur heard. Pulmonary/Chest: Effort normal and breath sounds normal. No respiratory distress. He has no wheezes. He has no rales. He exhibits no tenderness.  Abdominal: Soft. Bowel sounds are normal. He exhibits no distension. There is no tenderness.  Musculoskeletal: Normal range of motion. He exhibits no edema and no tenderness.  Lymphadenopathy:    He has no cervical adenopathy.  Neurological: He is alert and oriented to person, place, and time.  Coordination normal.  Skin: Skin is warm and dry. No rash noted. No erythema.  Psychiatric: He has a normal mood and affect. His behavior is normal. Judgment and thought content normal.      Assessment and Plan

## 2013-09-04 NOTE — Patient Instructions (Addendum)
You are doing well. No medication changes were made.  If the blood pressure runs on the end, consider cutting the losartan in 1/2 (50 mg pill) or stopping the amlodipine  Consider wearing the TED hose during the day, or with periods of inactivity  Please call us if you have new issues that need to be addressed before your next appt.  Your physician wants you to follow-up in: 12 months.  You will receive a reminder letter in the mail two months in advance. If you don't receive a letter, please call our office to schedule the follow-up appointment.

## 2013-09-04 NOTE — Assessment & Plan Note (Signed)
Wife reports dementia has been insidious, progressive. Followed by Dr. Malvin Johns.

## 2013-09-04 NOTE — Assessment & Plan Note (Signed)
Recent echocardiogram earlier in 2014. Repeat in several years time. Overall well-functioning valve

## 2014-09-04 ENCOUNTER — Ambulatory Visit (INDEPENDENT_AMBULATORY_CARE_PROVIDER_SITE_OTHER): Payer: Medicare Other | Admitting: Cardiovascular Disease

## 2014-09-04 ENCOUNTER — Encounter: Payer: Self-pay | Admitting: Cardiovascular Disease

## 2014-09-04 VITALS — BP 110/82 | HR 74 | Ht 66.0 in | Wt 180.0 lb

## 2014-09-04 DIAGNOSIS — I10 Essential (primary) hypertension: Secondary | ICD-10-CM

## 2014-09-04 DIAGNOSIS — Z952 Presence of prosthetic heart valve: Secondary | ICD-10-CM

## 2014-09-04 DIAGNOSIS — F0390 Unspecified dementia without behavioral disturbance: Secondary | ICD-10-CM

## 2014-09-04 DIAGNOSIS — Z954 Presence of other heart-valve replacement: Secondary | ICD-10-CM

## 2014-09-04 NOTE — Progress Notes (Signed)
Patient ID: Philip Ray, male    DOB: May 08, 1926, 80 y.o.   MRN: [id number]  HPI Comments: Philip Ray is a pleasant 80 year old gentleman with dementia/Parkinson's, diabetes type 2, history of colon cancer, esophageal cancer and surgery in 2002,  falls with right hip replacement, repeat fall while at nursing facility with left hip replacement now living at the [facility] [place]. He has a history of bioprosthetic aortic valve placed for aortic valve stenosis. Left leg DVT in 2007  Wife presents with him today. Overall he has been stable. Continues to have significant underlying dementia. His wife takes care of him and does most of his ADLs.  Previously seen by Dr. Melrose Nakayama, neurology.  Occasional falls, no trauma.  He tries to do some exercise but is not doing so a regular basis.  He has trouble standing  He has no complaints but is a poor historian. His wife is getting tired and is taking care of him full-time. She does have some help coming to the house to assist  His wife reports that there was no coronary artery disease on previous cardiac catheterization prior to valve replacement. He also had no significant coronary artery disease. Most of his evaluation was at [facility].  EKG shows normal sinus rhythm with rate 74 beats per minute, right bundle branch block ,no significant ST or T wave changes   Outpatient Encounter Prescriptions as of 09/04/2014  Medication Sig  . amLODipine (NORVASC) 2.5 MG tablet Take 2.5 mg by mouth daily.  Marland Kitchen aspirin 81 MG tablet Take 81 mg by mouth daily.  . Carbidopa-Levodopa (SINEMET PO) Take by mouth daily.  . Cholecalciferol (VITAMIN D) 2000 UNITS tablet Take 2,000 Units by mouth daily.  . citalopram (CELEXA) 20 MG tablet Take 20 mg by mouth daily.  . Coenzyme Q-10 100 MG capsule Take 100 mg by mouth daily.  Marland Kitchen donepezil (ARICEPT) 10 MG tablet Take 10 mg by mouth at bedtime.  . finasteride (PROSCAR) 5 MG tablet Takes 3 tablets per week.  . losartan (COZAAR) 100  MG tablet Take 100 mg by mouth daily.  . Multiple Minerals-Vitamins (CALCIUM CITRATE PLUS/MAGNESIUM PO) Take by mouth daily.  . Multiple Vitamin (MULTIVITAMIN) tablet Take 1 tablet by mouth daily.  . Omega-3 Fatty Acids (OMEGA-3 FISH OIL PO) Take 200 mg by mouth daily.  Marland Kitchen omeprazole (PRILOSEC) 20 MG capsule Takes 2 tablets daily.    Review of Systems  Constitutional: Negative.   HENT: Negative.   Eyes: Negative.   Respiratory: Negative.   Cardiovascular: Negative.   Gastrointestinal: Negative.   Endocrine: Negative.   Genitourinary: Positive for frequency and difficulty urinating.  Musculoskeletal: Negative.   Skin: Negative.   Allergic/Immunologic: Negative.   Neurological: Negative.   Hematological: Negative.   Psychiatric/Behavioral: Negative.   All other systems reviewed and are negative.   BP 110/82  Pulse 74  Ht 5\' 6"  (1.676 m)  Wt 180 lb (81.647 kg)  BMI 29.07 kg/m2  Physical Exam  Nursing note and vitals reviewed. Constitutional: He is oriented to person, place, and time. He appears well-developed and well-nourished.  HENT:  Head: Normocephalic.  Nose: Nose normal.  Mouth/Throat: Oropharynx is clear and moist.  Eyes: Conjunctivae are normal. Pupils are equal, round, and reactive to light.  Neck: Normal range of motion. Neck supple. No JVD present.  Cardiovascular: Normal rate, regular rhythm, S1 normal, S2 normal, normal heart sounds and intact distal pulses.  Exam reveals no gallop and no friction rub.   No murmur heard.  Pulmonary/Chest: Effort normal and breath sounds normal. No respiratory distress. He has no wheezes. He has no rales. He exhibits no tenderness.  Abdominal: Soft. Bowel sounds are normal. He exhibits no distension. There is no tenderness.  Musculoskeletal: Normal range of motion. He exhibits no edema and no tenderness.  Lymphadenopathy:    He has no cervical adenopathy.  Neurological: He is alert and oriented to person, place, and time.  Coordination normal.  Skin: Skin is warm and dry. No rash noted. No erythema.  Psychiatric: He has a normal mood and affect. His behavior is normal. Judgment and thought content normal.      Assessment and Plan

## 2014-09-04 NOTE — Patient Instructions (Signed)
You are doing well. No medication changes were made.  Please call us if you have new issues that need to be addressed before your next appt.  Your physician wants you to follow-up in: 12 months.  You will receive a reminder letter in the mail two months in advance. If you don't receive a letter, please call our office to schedule the follow-up appointment. 

## 2014-09-04 NOTE — Assessment & Plan Note (Signed)
Last echo in 02/2013. Stable

## 2014-09-04 NOTE — Assessment & Plan Note (Signed)
Managed by neurology. Currently not on his Parkinson's medications

## 2014-09-04 NOTE — Assessment & Plan Note (Signed)
Blood pressure is well controlled on today's visit. No changes made to the medications. 

## 2014-12-04 ENCOUNTER — Inpatient Hospital Stay: Payer: Self-pay | Admitting: Internal Medicine

## 2014-12-04 LAB — URINALYSIS, COMPLETE
BACTERIA: NONE SEEN
Bilirubin,UR: NEGATIVE
Glucose,UR: 150 mg/dL (ref 0–75)
KETONE: NEGATIVE
Leukocyte Esterase: NEGATIVE
Nitrite: NEGATIVE
Ph: 5 (ref 4.5–8.0)
Protein: 30
RBC,UR: 5 /HPF (ref 0–5)
Specific Gravity: 1.015 (ref 1.003–1.030)
Squamous Epithelial: <1

## 2014-12-04 LAB — TROPONIN I: Troponin-I: <0.02 ng/mL

## 2014-12-04 LAB — COMPREHENSIVE METABOLIC PANEL
Albumin: 2.8 g/dL — ABNORMAL LOW (ref 3.4–5.0)
Alkaline Phosphatase: 81 U/L
Anion Gap: 11 (ref 7–16)
BILIRUBIN TOTAL: 0.4 mg/dL (ref 0.2–1.0)
BUN: 60 mg/dL — AB (ref 7–18)
Calcium, Total: 9.2 mg/dL (ref 8.5–10.1)
Chloride: 109 mmol/L — ABNORMAL HIGH (ref 98–107)
Co2: 18 mmol/L — ABNORMAL LOW (ref 21–32)
Creatinine: 5.1 mg/dL — ABNORMAL HIGH (ref 0.60–1.30)
EGFR (African American): 14 — ABNORMAL LOW
EGFR (Non-African Amer.): 11 — ABNORMAL LOW
GLUCOSE: 216 mg/dL — AB (ref 65–99)
Osmolality: 299 (ref 275–301)
Potassium: 4.1 mmol/L (ref 3.5–5.1)
SGOT(AST): 14 U/L — ABNORMAL LOW (ref 15–37)
SGPT (ALT): 28 U/L
SODIUM: 138 mmol/L (ref 136–145)
Total Protein: 7.5 g/dL (ref 6.4–8.2)

## 2014-12-04 LAB — CBC
HCT: 43.1 % (ref 40.0–52.0)
HGB: 14 g/dL (ref 13.0–18.0)
MCH: 30.7 pg (ref 26.0–34.0)
MCHC: 32.6 g/dL (ref 32.0–36.0)
MCV: 94 fL (ref 80–100)
Platelet: 256 10*3/uL (ref 150–440)
RBC: 4.57 10*6/uL (ref 4.40–5.90)
RDW: 13.7 % (ref 11.5–14.5)
WBC: 10.6 10*3/uL (ref 3.8–10.6)

## 2014-12-05 LAB — BASIC METABOLIC PANEL
Anion Gap: 10 (ref 7–16)
BUN: 55 mg/dL — AB (ref 7–18)
CALCIUM: 8.1 mg/dL — AB (ref 8.5–10.1)
Chloride: 114 mmol/L — ABNORMAL HIGH (ref 98–107)
Co2: 16 mmol/L — ABNORMAL LOW (ref 21–32)
Creatinine: 4.42 mg/dL — ABNORMAL HIGH (ref 0.60–1.30)
EGFR (Non-African Amer.): 14 — ABNORMAL LOW
GFR CALC AF AMER: 16 — AB
Glucose: 137 mg/dL — ABNORMAL HIGH (ref 65–99)
OSMOLALITY: 297 (ref 275–301)
Potassium: 3.8 mmol/L (ref 3.5–5.1)
SODIUM: 140 mmol/L (ref 136–145)

## 2014-12-05 LAB — CBC WITH DIFFERENTIAL/PLATELET
BASOS PCT: 0.5 %
Basophil #: 0.1 10*3/uL (ref 0.0–0.1)
EOS PCT: 2.6 %
Eosinophil #: 0.3 10*3/uL (ref 0.0–0.7)
HCT: 34.8 % — ABNORMAL LOW (ref 40.0–52.0)
HGB: 11.4 g/dL — ABNORMAL LOW (ref 13.0–18.0)
LYMPHS ABS: 1.1 10*3/uL (ref 1.0–3.6)
Lymphocyte %: 11.7 %
MCH: 31 pg (ref 26.0–34.0)
MCHC: 32.8 g/dL (ref 32.0–36.0)
MCV: 95 fL (ref 80–100)
MONO ABS: 0.9 x10 3/mm (ref 0.2–1.0)
MONOS PCT: 9.5 %
Neutrophil #: 7.4 10*3/uL — ABNORMAL HIGH (ref 1.4–6.5)
Neutrophil %: 75.7 %
Platelet: 217 10*3/uL (ref 150–440)
RBC: 3.69 10*6/uL — AB (ref 4.40–5.90)
RDW: 13.9 % (ref 11.5–14.5)
WBC: 9.7 10*3/uL (ref 3.8–10.6)

## 2014-12-05 LAB — MAGNESIUM: Magnesium: 2.1 mg/dL

## 2014-12-06 LAB — RENAL FUNCTION PANEL
Albumin: 2.2 g/dL — ABNORMAL LOW (ref 3.4–5.0)
Anion Gap: 12 (ref 7–16)
BUN: 50 mg/dL — ABNORMAL HIGH (ref 7–18)
CREATININE: 4.32 mg/dL — AB (ref 0.60–1.30)
Calcium, Total: 8.3 mg/dL — ABNORMAL LOW (ref 8.5–10.1)
Chloride: 116 mmol/L — ABNORMAL HIGH (ref 98–107)
Co2: 15 mmol/L — ABNORMAL LOW (ref 21–32)
EGFR (African American): 17 — ABNORMAL LOW
GFR CALC NON AF AMER: 14 — AB
Glucose: 123 mg/dL — ABNORMAL HIGH (ref 65–99)
OSMOLALITY: 300 (ref 275–301)
PHOSPHORUS: 4.2 mg/dL (ref 2.5–4.9)
Potassium: 3.8 mmol/L (ref 3.5–5.1)
Sodium: 143 mmol/L (ref 136–145)

## 2014-12-06 LAB — PLATELET COUNT: Platelet: 229 x10 3/mm 3 (ref 150–440)

## 2014-12-07 LAB — RENAL FUNCTION PANEL
ALBUMIN: 2.1 g/dL — AB (ref 3.4–5.0)
Anion Gap: 12 (ref 7–16)
BUN: 44 mg/dL — AB (ref 7–18)
CHLORIDE: 118 mmol/L — AB (ref 98–107)
Calcium, Total: 8.4 mg/dL — ABNORMAL LOW (ref 8.5–10.1)
Co2: 13 mmol/L — ABNORMAL LOW (ref 21–32)
Creatinine: 4.26 mg/dL — ABNORMAL HIGH (ref 0.60–1.30)
EGFR (African American): 17 — ABNORMAL LOW
GFR CALC NON AF AMER: 14 — AB
Glucose: 129 mg/dL — ABNORMAL HIGH (ref 65–99)
OSMOLALITY: 298 (ref 275–301)
Phosphorus: 4.2 mg/dL (ref 2.5–4.9)
Potassium: 3.9 mmol/L (ref 3.5–5.1)
Sodium: 143 mmol/L (ref 136–145)

## 2014-12-08 ENCOUNTER — Encounter: Payer: Self-pay | Admitting: Internal Medicine

## 2014-12-10 LAB — BASIC METABOLIC PANEL
ANION GAP: 10 (ref 7–16)
BUN: 45 mg/dL — ABNORMAL HIGH (ref 7–18)
Calcium, Total: 8.7 mg/dL (ref 8.5–10.1)
Chloride: 114 mmol/L — ABNORMAL HIGH (ref 98–107)
Co2: 19 mmol/L — ABNORMAL LOW (ref 21–32)
Creatinine: 3.66 mg/dL — ABNORMAL HIGH (ref 0.60–1.30)
EGFR (African American): 20 — ABNORMAL LOW
GFR CALC NON AF AMER: 17 — AB
Glucose: 144 mg/dL — ABNORMAL HIGH (ref 65–99)
OSMOLALITY: 299 (ref 275–301)
Potassium: 3.8 mmol/L (ref 3.5–5.1)
SODIUM: 143 mmol/L (ref 136–145)

## 2014-12-11 ENCOUNTER — Telehealth: Payer: Self-pay | Admitting: *Deleted

## 2014-12-11 NOTE — Telephone Encounter (Signed)
Left message for pt to call back  °

## 2014-12-11 NOTE — Telephone Encounter (Signed)
Please call patient's wife. She is confused about his medications and his last ekg. She doesn't know the name of his meds???

## 2014-12-11 NOTE — Telephone Encounter (Signed)
Spoke w/ pt's wife.   She reports that pt was in the hospital recently and was discharged to [facility].  She states that she is unsure if any of pt's meds were changed and that she wants to know if Dr. Rockey Situ would like to make any changes based on pt's EKG.  Advised her that I reviewed pt's discharge instructions and it does not indicate that any med changes were made.  Also, his EKG is unchanged from Sept 2015. She states that it is very difficult to get pt in for an appt.  She will call back w/ any questions or concerns.

## 2014-12-12 LAB — CBC WITH DIFFERENTIAL/PLATELET
Basophil #: 0.1 10*3/uL (ref 0.0–0.1)
Basophil %: 1.2 %
Eosinophil #: 0.4 10*3/uL (ref 0.0–0.7)
Eosinophil %: 3.8 %
HCT: 39.1 % — AB (ref 40.0–52.0)
HGB: 12.8 g/dL — ABNORMAL LOW (ref 13.0–18.0)
Lymphocyte #: 1.2 10*3/uL (ref 1.0–3.6)
Lymphocyte %: 13.2 %
MCH: 30.6 pg (ref 26.0–34.0)
MCHC: 32.6 g/dL (ref 32.0–36.0)
MCV: 94 fL (ref 80–100)
MONOS PCT: 10.5 %
Monocyte #: 1 x10 3/mm (ref 0.2–1.0)
NEUTROS PCT: 71.3 %
Neutrophil #: 6.8 10*3/uL — ABNORMAL HIGH (ref 1.4–6.5)
Platelet: 237 10*3/uL (ref 150–440)
RBC: 4.16 10*6/uL — ABNORMAL LOW (ref 4.40–5.90)
RDW: 14 % (ref 11.5–14.5)
WBC: 9.5 10*3/uL (ref 3.8–10.6)

## 2014-12-12 LAB — COMPREHENSIVE METABOLIC PANEL
ALBUMIN: 2.5 g/dL — AB (ref 3.4–5.0)
ALK PHOS: 66 U/L
ANION GAP: 12 (ref 7–16)
AST: 22 U/L (ref 15–37)
BUN: 47 mg/dL — ABNORMAL HIGH (ref 7–18)
Bilirubin,Total: 0.3 mg/dL (ref 0.2–1.0)
CALCIUM: 8.8 mg/dL (ref 8.5–10.1)
CHLORIDE: 110 mmol/L — AB (ref 98–107)
CO2: 20 mmol/L — AB (ref 21–32)
CREATININE: 3.63 mg/dL — AB (ref 0.60–1.30)
GFR CALC AF AMER: 21 — AB
GFR CALC NON AF AMER: 17 — AB
Glucose: 150 mg/dL — ABNORMAL HIGH (ref 65–99)
OSMOLALITY: 298 (ref 275–301)
POTASSIUM: 4.2 mmol/L (ref 3.5–5.1)
SGPT (ALT): 25 U/L
Sodium: 142 mmol/L (ref 136–145)
Total Protein: 6.4 g/dL (ref 6.4–8.2)

## 2014-12-12 LAB — PROTEIN / CREATININE RATIO, URINE
Creatinine, Urine: 61.5 mg/dL (ref 30.0–125.0)
PROTEIN/CREAT. RATIO: 1382 mg/g{creat} — AB (ref 0–200)
Protein, Random Urine: 85 mg/dL — ABNORMAL HIGH (ref 0–12)

## 2014-12-12 LAB — PHOSPHORUS: Phosphorus: 3.4 mg/dL (ref 2.5–4.9)

## 2014-12-17 LAB — RAPID INFLUENZA A&B ANTIGENS

## 2014-12-20 ENCOUNTER — Encounter: Payer: Self-pay | Admitting: Internal Medicine

## 2014-12-20 ENCOUNTER — Ambulatory Visit
Admit: 2014-12-20 | Disposition: A | Discharge status: Home / Self Care | Payer: Self-pay | Attending: Nurse Practitioner | Admitting: Nurse Practitioner

## 2015-01-20 ENCOUNTER — Encounter: Payer: Self-pay | Admitting: Internal Medicine

## 2015-02-18 ENCOUNTER — Encounter
Admit: 2015-02-18 | Disposition: A | Discharge status: Home / Self Care | Payer: Self-pay | Attending: Internal Medicine | Admitting: Internal Medicine

## 2015-03-21 ENCOUNTER — Encounter
Admit: 2015-03-21 | Disposition: A | Discharge status: Home / Self Care | Payer: Self-pay | Attending: Internal Medicine | Admitting: Internal Medicine

## 2015-03-21 ENCOUNTER — Ambulatory Visit: Payer: Self-pay | Admitting: Nurse Practitioner

## 2015-03-21 DEATH — deceased

## 2015-04-12 NOTE — Discharge Summary (Signed)
PATIENT NAME:  Philip Ray, Philip Ray MR#:  [id number] DATE OF BIRTH:  [birth year]  DATE OF ADMISSION:  12/04/2014 DATE OF DISCHARGE:  12/07/2014   DISCHARGE DIAGNOSES:  1.  Acute on chronic renal failure due to dehydration.  2.  Urinary tract infection.  3.  Dementia.  4.  Hypertension.   CODE STATUS: FULL CODE.   HOME MEDICATIONS: Please refer to the medication reconciliation list.   DIET: Regular diet.   ACTIVITY: As tolerated.   FOLLOW-UP:  With primary care physician and Dr. Candiss Norse within 1 to 2 weeks.   REASON FOR ADMISSION: Poor oral intake and weakness.   HOSPITAL COURSE: The patient is an 81 year old Caucasian male with a history of hypertension, diabetes, CKD, hyperlipidemia who was sent to ED from home due to poor oral intake and generalized weakness.  The patient is demented.  According to the patient's wife and daughter, the patient has a poor oral intake and weakness for the past 1 week.  For detailed history and physical examination, please refer to the admission note dictated by me on admission date.  The patient's laboratory data showed increased creatinine at 5.1 and BUN 60. Electrolytes were normal.  1.  Acute renal failure on chronic kidney disease which is possibly due to poor oral intake. After admission, the patient has been treated with IV fluid support and creatinine and BUN gradually decreased.  Today's BUN is a 44, creatinine 4.26. 2.  Urinary tract infection. The patient has been treated with Rocephin and we will change to p.o. Keflex after discharge.  3.  Hypertension, controlled with Norvasc. Losartan was discontinued due to acute renal failure.  4.  Diabetes has been controlled.  5.  According to physical therapy evaluation. the patient needs rehab.  The patient has no complaints. Vital signs are stable.  He is clinically stable and will be discharged to rehabilitation today.  I discussed the patient's discharge plan with the patient's nurse.   TIME SPENT: About 35  minutes.     ____________________________ Demetrios Loll, MD qc:DT D: 12/07/2014 10:07:48 ET T: 12/07/2014 10:35:20 ET JOB#: [id number]  cc: Demetrios Loll, MD, <Dictator> Demetrios Loll MD ELECTRONICALLY SIGNED 12/07/2014 15:01

## 2015-04-12 NOTE — H&P (Signed)
PATIENT NAME:  Philip Ray, Philip Ray MR#:  [id number] DATE OF BIRTH:  1926-05-23  DATE OF ADMISSION:  12/04/2014  REFERRING PHYSICIAN: Arline Asp. EMERGENCY ROOM  PHYSICIAN: Wells Guiles L. Lord, MD  CHIEF COMPLAINT:  Poor oral intake and weakness.   HISTORY OF PRESENT ILLNESS: An 81 year old Caucasian male with a history of hypertension, diabetes, hyperlipidemia, who was sent from home due to poor oral intake and generalized weakness. The patient is demented and unable to provide any information but the patient denies any symptoms. No complaints. According to patient's daughter and the patient's wife, the patient had diarrhea 1 week ago on and off. In addition, the patient has had poor oral intake and generalized weakness. He is not able to walk. The patient was noted to have elevated creatinine, his baseline was about 2.   PAST MEDICAL HISTORY: Hypertension, diabetes, hyperlipidemia, depression, dementia, GERD, BPH, Barrett's esophagus, history of left lower extremity DVT, CKD stage 3, GI bleeding due to AVM, history of aortic valve replacement in 2007.   PAST SURGICAL HISTORY:  1. Right hemicolectomy, esophagogastrectomy.  2. Lumbar laminectomy.  3. Transurethral resection of prostate. 4. Aortic valve replacement.  5. Appendectomy.   SOCIAL HISTORY: No smoking or drinking or illicit drugs.   FAMILY HISTORY: According to previous documents, the patient's mother died of cancer.   REVIEW OF SYSTEMS: Unable to obtain due to the patient's mental dementia status.   ALLERGIES: FLOMAX.    HOME MEDICATIONS:  1. Vitamin D3 2000 international units 2 tablets p.o. daily.  2. Omeprazole 20 mg p.o. b.i.d. 3. Multivitamin 1 dose once a day.  4. Memantine  5 mg p.o. daily.  5. Losartan 50 mg p.o. daily.  6. Finasteride 5 mg p.o. 3 times a week.  7. Co-enzyme Q10 200 mg p.o. daily.  8. Citracal with magnesium therapeutic vitamine with mineral 1 tab po daily .  9. Multivitamins with minerals p.o. tablets once a  day.  10. Citalopram 10 mg p.o. daily.  11. Aricept 10 mg p.o. at bedtime.  12.  Norvasc 2.5 mg p.o. daily.   PHYSICAL EXAMINATION:  VITAL SIGNS:  Temperature 97.7, blood pressure 158/90, pulse 67, O2 saturation 99% on room air.  GENERAL: The patient is alert but is demented, in no acute distress.  HEENT: Pupils round, equal, reactive to light and accommodation.  NECK: Supple. No JVD or carotid bruits are noted, no masses. No thyromegaly.  CARDIOVASCULAR: S1, S2 regular rate and rhythm. No murmurs or gallops.  PULMONARY: Bilateral air entry. No wheezing or rales. No use of accessory muscle to breathe.  ABDOMEN: Soft. No distention or tenderness. No organomegaly. Bowel sounds present.  EXTREMITIES: No edema, clubbing or cyanosis. No calf tenderness. Bilateral pedal pulses present. SKIN: No rash or jaundice. NEUROLOGIC: The patient is demented, but follows commands. No focal deficit. Power 3-4/5. Sensory intact.   LABORATORY DATA: Lactic acid 1.0.   CAT scan of the abdomen and pelvis shows abnormal appearance of kidney due to multiple renal cysts, collecting system calculous. Please see detailed report from radiology.  Urinalysis showed a WBC 12, RBC 5, nitrite negative.   CAT scan of head without contrast showed no acute abnormality. A chest x-ray, no acute cardiopulmonary disease. Troponin less than 0.02. CBC in normal range. Glucose 216, BUN 60, creatinine 5.110, electrolytes are normal. Albumin 2.8.   IMPRESSIONS: 1. Acute renal failure on chronic kidney disease . Possibly due to poor oral intake.   2. Urinary tract infection.  3. Hypertension.  4. Diabetes.  5. Hyperlipidemia.  6. Dementia.  7. History of BPH.  PLAN OF TREATMENT: 1. The patient will be admitted to medical floor. We will start IV fluids with normal saline. Follow up BMP and we will get a nephrology consult. Hold losartan due to acute renal failure.  2. For diabetes, we will start sliding scale.  3. For urinary  tract infection, we will start Rocephin.  Follow up urine culture and CBC.  4. For hypertension, continue Norvasc. 5. I discussed the patient's condition and plan of treatment with the patient's wife and daughter.   CODE STATUS: DNR.   TIME SPENT: About 57 minutes    ____________________________ Demetrios Loll, MD qc:ST D: 12/04/2014 21:24:06 ET T: 12/04/2014 22:01:00 ET JOB#: [id number]  cc: Demetrios Loll, MD, <Dictator> Demetrios Loll MD ELECTRONICALLY SIGNED 12/05/2014 16:20

## 2015-04-13 NOTE — Discharge Summary (Signed)
PATIENT NAME:  Philip Ray, Philip Ray MR#:  [id number] DATE OF BIRTH:  1926-07-06  DATE OF ADMISSION:  03/02/2012 DATE OF DISCHARGE:  03/08/2012  ADMITTING DIAGNOSIS: Fracture of the left hip.   DISCHARGE DIAGNOSIS: Fracture of the left hip.   HISTORY: The patient is an 81 year old male patient who was at [facility] for rehab after having a right hip hemiarthroplasty and apparently fell and landed on his left hip and side. The patient denied any loss of consciousness or other injuries. He did not recall any exact mechanism of injury. Prior to the fall he was ambulating with a walker. He subsequently was brought to [facility] ER where x-rays were taken and revealed a displaced left femoral neck fracture. After discussion of the risks and benefits of surgical intervention, the patient's family members agreed with plans for surgical intervention.   The patient was seen by the hospitalist Dr. Karsten Fells for medical clearance. Felt to be low risk. The patient's INR, however, was elevated and subsequently Coumadin and Lovenox were discontinued to allow his INR to return to a surgical level.   PROCEDURE: Left hip hemiarthroplasty.   ANESTHESIA: Spinal.   IMPLANTS UTILIZED: DePuy size five Summit femoral stem cemented, 11-mm centralizer, 28-mm cobalt chrome hip ball, and +1.5-mm neck segment, 51-mm outer diameter center locking bipolar head, and a size three cement restrictor.   INR was 1.3 at the time of surgery.   HOSPITAL COURSE: The patient tolerated the procedure very well. He had no complications. He was then taken to the PAC-U where he was stabilized and then transferred to the orthopedic floor. He began receiving Lovenox 30 mg subcutaneous every 12 hours per anesthesia and pharmacy protocol. He was fitted with TED stockings bilaterally. These are allowed to be removed one hour per eight-hour shift. The patient was also fitted with the AV-I compression foot pumps set at 80 mmHg.  However, the patient did not like these and would not leave them on. Subsequently the pressure was dropped to 40 and he continued to remove the boots.  However, on day three he was wearing the pumps more regularly.  He has had no evidence of any deep venous thromboses. Calves have been nontender. Negative Homans sign. Heels were elevated off the bed using rolled towels. Heels were nontender. No tissue breakdown was noted.   The patient has denied any chest pain or shortness of breath. Vital signs have been stable. He has been afebrile. Hemodynamically he was stable. No transfusions were given. His INR has been 1.2 following surgery.   Physical therapy was initiated on day one for gait training and transfers. This has been extremely slow. Upon being transferred he was only ambulating approximately 25 feet. Occupational therapy was also initiated on day one for activities of daily living and assistive devices.   The patient's IV, Foley and Hemovac were all discontinued on day two along with a dressing change. The wound has been free of any drainage or signs of infection.   DISPOSITION: The patient is being discharged to a skilled nursing facility in improved stable condition.   DISCHARGE INSTRUCTIONS:  1. He is to weight bear as tolerated. Continue with physical therapy for gait training and transfers.  2. Occupational therapy for activities of daily living and assistive devices.  3. Abduction pillows between legs when in bed.  4. Elevate heels off the bed.  5. TED stockings are to be worn around the clock. These may be removed one hour per eight hour shift.  6. Incentive spirometer q.1 hour while awake.  7. Encourage cough and deep breathing q. 2 hours while awake.  8. He is placed on an ADA diet.  9. He will need to follow up in the clinic in two weeks.  10. Staples are to be removed on March 30 and apply benzoin and half-inch Steri-Strips.   DRUG ALLERGIES: Flomax.   MEDICATIONS: 1. Tylenol  ES 500 to 1000 mg every four hours p.r.n.  2. Roxicodone 5 to 10 mg every four hours p.r.n. 3. Ultram 50 to 100 mg every four hours p.r.n.  4. Senokot-S 1 tablet b.i.d.  5. Milk of Magnesia 30 mL b.i.d.  6. Dulcolax suppository 10 mg rectally p.r.n.  7. Enema soapsuds if no results with milk of magnesia or Dulcolax.  8. Mylanta DS 30 mL Q. 6 hours p.r.n.  9. Insulin sliding scale Novolin R injections. 10. Nizoral 2% cream, applied to affected area b.i.d.  11. Vitamin D3 1000 units q. 12 hours.  12. Celexa 20 mg daily.  13. Norvasc 2.5 mg daily.  14. Cozaar 100 mg daily.  15. Proscar 5 mg daily. 16. Pantoprazole 40 mg b.i.d.  25. Zeasorb, apply topically daily, patient's own medicine.  18. Lovenox 30 mg subcutaneous q. 24 hours for 14 days, then discontinue and begin taking one 81-mg enteric-coated aspirin.   PAST MEDICAL HISTORY:  1. Dementia versus pseudodementia.  2. Benign prostatic hypertrophy.  3. Deep vein thrombosis.  4. Esophageal cancer.  5. Hyperlipidemia.  6. Kidney cyst.  7. Chronic kidney disease stage III.  8. Kidney cyst. 9. History of Barrett's esophagitis.  10. History of anemia.  11. Diabetes, diet-controlled.  12. Heart murmur.  13. Gastroesophageal reflux disease. 14. Hypertension. 15. Anti C  ____________________________ Vance Peper, PA jrw:bjt D:  03/08/2012 10:34:53 ET          T: 03/08/2012 10:55:09 ET         JOB#: [id number]  [place] PA ELECTRONICALLY SIGNED 03/09/2012 21:14

## 2015-04-13 NOTE — H&P (Signed)
Subjective/Chief Complaint Left hip pain    History of Present Illness 81 year old male at [facility] for rehab after a right hip hemiarthroplasty apparently fell and landed on his left hip and side. He denied any loss of consciousness or other injury. He does not recall the exact mechanism of injury. He was ambulating with a walker prior to the fall.   Past Med/Surgical Hx:  Dementia vs. pseudodementia:   BPH:   Deep venous thrombosis:   Esophageal cancer (2002):   Hyperlipidemia:   Kidney cysts- Chronic Kidney Disease Stage 3:   Kidney cysts:   Hx of Barrett's esophagitis:   Hx of anemia:   Diabetes (Diet Controlled):   heart murmur:   GERD:   hypertension:   Anti C: carries card  Left unicompartmental knee arthroplasty:   Aortic valve replacement (porcine):   Right hip hemiarthroplasty:   Right hemicolectomy:   TURP:   Esophagogastrectomy:   Lumbar Laminectomy:   appendectomy:   ALLERGIES:  Flomax: Hypotension  Other -Explain in Comment: Hives  HOME MEDICATIONS: Medication Instructions Status  amlodipine 2.5 mg tablet 1 tab(s) orally once a day  Active  omeprazole 20 mg enteric coated tablet 1 tab(s) orally 2 times a day Active  losartan 100 mg oral tablet 1 tab(s) orally once a day Active  citalopram 20 mg oral tablet 1 tab(s) orally once a day Active  docusate-senna 50 mg-8.6 mg oral tablet 1 tab(s) orally 2 times a day Active  zolpidem 5 mg oral tablet 1 tab(s) orally once a day (at bedtime) Active  magnesium hydroxide 8% oral suspension 30 milliliter(s) orally 2 times a day, As Needed- for Constipation  Active  bisacodyl 10 milligram(s) rectal once a day, As Needed- for Constipation  Active  aluminum-magnesium hydroxides extra strength 30 milliliter(s) orally every 6 hours, As Needed- for Indigestion, Heartburn  Active  warfarin 3 mg oral tablet 1 tab(s) orally once a day Active  acetaminophen 500 mg oral tablet tab(s) orally every 6 hours, As Needed- for  Pain  Active   Family and Social History:   Family History Non-Contributory    Social History negative tobacco, negative ETOH, negative Illicit drugs, Married. Retired Chief Financial Officer.    Place of Living SND   Review of Systems:   Fever/Chills No    Cough No    Sputum No    Abdominal Pain No    Diarrhea No    Constipation No    Nausea/Vomiting No    SOB/DOE No    Chest Pain No    Dysuria No   Physical Exam:   GEN WD, WN, NAD    HEENT PERRL, hearing intact to voice, moist oral mucosa, good dentition    NECK supple    RESP normal resp effort  clear BS    CARD regular rate  murmur present  No LE edema  no JVD    ABD denies tenderness  soft    GU foley catheter in place    EXTR Left lower extremity is shortened and externally rotated. Pain is elicited with attempts at range of motion of the hip. No gross tenderness to palpation of the knee. No gross knee effusion.    NEURO motor/sensory function intact    PSYCH alert, poor insight     Assessment/Admission Diagnosis Displaced left femoral neck fracture    Plan Recommend left hip hemiarthroplasty.  The risks and benefits of surgical intervention were discussed in detail with the patient and his family.  They expressed understanding of the risks and benefits and agreed with plans for surgery.   Surgical site signed as per "right site surgery" protocol.   Will consult Medicine for preoperative evaluation and perioperative medical management. Will need to normalize the Prothrombin Time before surgery.   Electronic Signatures: Dereck Leep (MD)  (Signed 14-Mar-13 20:11)  Authored: CHIEF COMPLAINT and HISTORY, PAST MEDICAL/SURGIAL HISTORY, ALLERGIES, HOME MEDICATIONS, FAMILY AND SOCIAL HISTORY, REVIEW OF SYSTEMS, PHYSICAL EXAM, ASSESSMENT AND PLAN   Last Updated: 14-Mar-13 20:11 by Dereck Leep (MD)

## 2015-04-13 NOTE — Consult Note (Signed)
PATIENT NAME:  Philip Ray, Philip Ray MR#:  [id number] DATE OF BIRTH:  11/18/1926  DATE OF CONSULTATION:  01/27/2012  REFERRING PHYSICIAN:   CONSULTING PHYSICIAN:  Vivek J. Verdell Carmine, MD  PRIMARY CARE PHYSICIAN: Dr. Anola Gurney   REASON FOR CONSULTATION: Preoperative evaluation and medical management.   HISTORY OF PRESENT ILLNESS: This is an 81 year old male who presents to the hospital after suffering a mechanical fall and noted to have a right hip intratrochanteric fracture. Hospitalist services were contacted for medical management and evaluation. Patient denied any prodromal symptoms prior to the fall. He says that he was getting out of the shower and slipped and fell. His wife brought him to the ER and he was noted to have a right hip fracture.   Patient denies any chest pain, shortness of breath, nausea, vomiting, abdominal pain, fevers, chills, cough, or any other associated symptoms.   REVIEW OF SYSTEMS: CONSTITUTIONAL: No documented fever. No weight gain. No weight loss. EYES: No blurry or double vision. ENT: No tinnitus. No postnasal drip. No redness of the oropharynx. RESPIRATORY: No cough, no wheeze, no hemoptysis. CARDIOVASCULAR: No chest pain, no orthopnea, no palpitations, no syncope. GASTROINTESTINAL: No nausea, no vomiting, no diarrhea, no abdominal pain, no melena, no hematochezia. GENITOURINARY: No dysuria, no hematuria. ENDOCRINE: No polyuria, nocturia. No heat or cold intolerance. HEME: No anemia, no bruising, no bleeding. INTEGUMENTARY: No rashes. No lesions. MUSCULOSKELETAL: No arthritis, no swelling, no gout. NEUROLOGIC: No numbness, no tingling, no ataxia, no seizure-type activity. PSYCH: No anxiety, no insomnia, no ADD.   PAST MEDICAL HISTORY:  1. Hypertension.  2. Diet-controlled diabetes.  3. Hyperlipidemia.  4. History of Barrett's esophagus. 5. Gastroesophageal reflux disease.  6. History of left lower extremity deep vein thrombosis about six years ago, currently now on  Coumadin. 7. Chronic kidney disease stage III.  8. History of GI bleed secondary to AV malformations.  9. History of aortic valve replacement, porcine valve in 2007.   ALLERGIES: Flomax.   SOCIAL HISTORY: No smoking. No alcohol abuse. No illicit drug abuse. Lives at home with his wife.   FAMILY HISTORY: Mother died from cancer of unknown type. He cannot recall his father's medical history.   CURRENT MEDICATIONS:  1. Cozaar 100 mg daily.  2. Amlodipine 2.5 mg daily.  3. Omeprazole 20 mg b.i.d.  4. Proscar 5 mg 3 times weekly.  5. Multivitamin daily.  6. Celexa 20 mg daily.   PHYSICAL EXAMINATION ON ADMISSION:  VITAL SIGNS: Temperature 98, pulse 81, respirations 20, blood pressure 180/119, sats 93% on room air.   GENERAL: He is a pleasant appearing male, no apparent distress.   HEENT: Atraumatic, normocephalic. Extraocular muscles are intact. Pupils equal, reactive to light. Sclerae anicteric. No conjunctival injection. No oropharyngeal erythema.   NECK: Supple. There is no jugular venous distention. No bruits, no lymphadenopathy, no thyromegaly.   HEART: Regular rate, rhythm. He does have a 2/6 systolic ejection murmur heard at the right sternal border.   LUNGS: Clear to auscultation anteriorly. No rales, no rhonchi, no wheezes. Normal to percussion. Negative use of accessory muscles.   ABDOMEN: Soft, flat, nontender, nondistended. Has good bowel sounds. No hepatosplenomegaly appreciated.   EXTREMITIES: No evidence of any cyanosis, clubbing, or peripheral edema. His right lower extremity is in traction secondary to the fracture.   NEUROLOGICAL: He is alert, awake, oriented x3 with no focal motor or sensory deficits appreciated bilaterally.   SKIN: Moist, warm with no rash appreciated.   LYMPHATIC: There is no cervical or  axillary lymphadenopathy.   LABORATORY, DIAGNOSTIC AND RADIOLOGICAL DATA: Serum glucose 144, BUN 34, creatinine 2.2, sodium 143, potassium 4, chloride 108,  bicarbonate 25. LFTs are within normal limits. White cell count 13.2, hemoglobin 14.2, hematocrit 42.9, platelet count 141.   Patient did have an x-ray of his right hip which showed mildly displaced right femoral neck fracture.   ASSESSMENT AND PLAN: 81 year old male with past medical history of hypertension, hyperlipidemia, history of chronic renal failure, diet-controlled diabetes, gastroesophageal reflux disease, history of aortic valve replacement came to the hospital after a fall and noted to have a right hip fracture.  1. Preoperative evaluation. Patient is low to moderate risk for noncardiac surgery. Currently there is no absolute contraindication to surgery at this time. His EKG has been reviewed, shows right bundle branch block with left axis deviation and no acute ST-T wave changes. This is unchanged from his previous EKG.  2. Hypertension. His blood pressure is sort of on the higher side presently. Possibly this could be pain related. I will continue his Cozaar and Norvasc for now and add some p.r.n. hydralazine if needed.  3. Gastroesophageal reflux disease. Continue Prilosec.  4. Benign prostatic hypertrophy. Continue with Proscar. 5. Chronic renal insufficiency. Patient's creatinine is at baseline and will monitor. Renal dose his medications, avoid nephrotoxins.  6. Depression. Continue with Celexa.  7. CODE STATUS: Patient is a FULL CODE.   Thank you so much for the consultation. Will follow along with you.   TIME SPENT: 50 minutes.  ____________________________ Belia Heman. Verdell Carmine, MD vjs:cms D: 01/27/2012 21:43:25 ET T: 01/28/2012 09:24:33 ET JOB#: [id number]  cc: Belia Heman. Verdell Carmine, MD, <Dictator> Dr. Anola Gurney  Henreitta Leber MD ELECTRONICALLY SIGNED 01/31/2012 3:14

## 2015-04-13 NOTE — Op Note (Signed)
PATIENT NAME:  Philip Ray, Philip Ray MR#:  [id number] DATE OF BIRTH:  05/01/1926  DATE OF PROCEDURE:  03/04/2012  PREOPERATIVE DIAGNOSIS: Displaced left femoral neck fracture.   POSTOPERATIVE DIAGNOSIS: Displaced left femoral neck fracture.   PROCEDURE PERFORMED: Left hip hemiarthroplasty (bipolar).   SURGEON: Laurice Record. Hooten, MD  ANESTHESIA: Spinal.   ESTIMATED BLOOD LOSS: 300 mL.  FLUIDS REPLACED: 1,100 mL of crystalloid.  DRAINS: Two medium drains to Hemovac reservoir.   IMPLANTS UTILIZED: DePuy size five Summit femoral stem (cemented), 11 mm cementralizer, 28 mm cobalt chrome hip ball with a +5 mm neck length, 51 mm outer diameter self-centering bipolar head, and a size three cement restrictor.   INDICATIONS FOR SURGERY: The patient is an 81 year old male who was recovering from right hip hemiarthroplasty from approximately one month ago for femoral neck fracture. He was apparently in rehabilitation facility when he got out of bed and fell, landing on his left hip. X-rays demonstrated displaced left femoral neck fracture. After a discussion of the risks and benefits of surgical intervention, the patient and his family expressed their understanding of the risks and benefits and agreed with plans for surgical intervention.   PROCEDURE IN DETAIL: The patient was brought into the Operating Room and, after adequate spinal anesthesia was achieved, the patient was placed in right lateral decubitus position. Axillary roll was placed and all bony prominences were well padded. The left hip and leg were cleaned and prepped with alcohol and DuraPrep and draped in the usual sterile fashion. A "time-out" was performed as per usual protocol. A lateral curvilinear incision was made gently curving towards the posterior superior iliac spine. IT band was incised in line with the skin incision and the fibers of the gluteus maximus were split in line. Piriformis tendon was identified, skeletonized, and incised at its  insertion at the proximal femur and reflected posteriorly. In a similar fashion, the short external rotators were incised and reflected posteriorly. A T-type posterior capsulotomy was performed. The femoral head was then removed using a corkscrew device and measured using calipers. It was felt that a 51 mm diameter was appropriate. This was confirmed with the ring gauges. The acetabulum was irrigated and suctioned dry and inspected for any residual fracture fragments. The articular surface was in good condition. The femoral neck cut was performed using an oscillating saw. Reamer was used to create a pilot hole and the graduated reamer was then passed down the canal. Serial broaches were inserted up to a size five broach. Trial reduction was performed with a 51 mm self-centering head with first a 28 hip ball with a +1.5 mm neck segment and subsequently with a +5 mm neck segment. The +5 mm neck segment provided good equalization of limb lengths and excellent stability. Trial components were removed. The canal was measured and it was felt that a size three cement restrictor was appropriate. The cement restrictor was inserted down the appropriate depth. Proximal femoral canal was irrigated with copious amounts of normal saline with antibiotic solution using pulsatile lavage and then suctioned dry. The canal was then packed with vaginal packing soaked in dilute Neo-Synephrine. Polymethyl methacrylate cement was prepared in the usual fashion using a vacuum mixer. Vaginal packing was removed and the canal again irrigated and suctioned dry. The cement was introduced in a retrograde fashion and then pressurized. A size five Summit femoral component with an 11 mm distal cementralizer was positioned and impacted into place. Excellent pressurization was appreciated. Excess cement was removed using freer elevators.  After adequate curing of cement, trial reduction was again performed with a 51 mm self-centering head with 28 mm  hip ball with a +5 mm neck length. Again, good equalization of limb lengths was appreciated and excellent stability appreciated. Trial components were removed. The Morse taper was cleaned and dried. A 51 mm outer diameter self-centering bipolar head was selected and a 28 mm cobalt chrome hip ball with a +5 mm neck segment was placed on the trunnion and impacted into place. The polyethylene insert for the bipolar head was then placed over the 20 mm hip ball and the outer ball snapped into position. The canal was irrigated and suctioned dry. The hip was then reduced and placed through a range of motion. Again, good equalization of limb lengths and excellent stability appreciated.   The wound was irrigated with copious amounts of normal saline with antibiotic solution using pulsatile lavage and then suctioned dry. Good hemostasis was noted. The T-type posterior capsulotomy was repaired using #5 Ethibond. The piriformis tendon was reapproximated to the undersurface of the gluteus medius tendon using #5 Ethibond. Two medium Hemovac drains were placed in the wound bed and brought out through a separate stab incision to be attached to a Hemovac reservoir. The IT band was repaired using interrupted sutures of #1 Vicryl. The subcutaneous tissue was approximated in layers using first #0 Vicryl followed by 2-0 Vicryl. Skin was closed with skin staples. A sterile dressing was applied.    The patient tolerated the procedure well. He was transported to the recovery room in stable condition.   ____________________________ Laurice Record. Holley Bouche., MD jph:ap D: 03/04/2012 11:54:40 ET T: 03/04/2012 12:56:45 ET JOB#: [id number]  cc: Jeneen Rinks P. Holley Bouche., MD, <Dictator> Laurice Record Holley Bouche MD ELECTRONICALLY SIGNED 03/05/2012 21:37

## 2015-04-13 NOTE — Discharge Summary (Signed)
PATIENT NAME:  Philip Ray, Philip Ray MR#:  [id number] DATE OF BIRTH:  21-May-1926  DATE OF ADMISSION:  01/27/2012 DATE OF DISCHARGE:  01/31/2012  ADMITTING DIAGNOSIS: Displaced right femoral neck fracture.   DISCHARGE DIAGNOSIS:   1. Displaced right femoral neck fracture, status post hip hemiarthroplasty.  2. Elevated creatinine, stable.  3. Acute blood loss anemia.  4. Thrombocytopenia with history of thrombocytopenia, stable.   ATTENDING: Hessie Knows, MD, [place]   CONSULTING PHYSICIANS:  1. Dr. Abel Presto.  2. Dr. Dagoberto Ligas. 3. Dr. Deanne Coffer. 4. Dr. Margaretmary Eddy.  PROCEDURE: On February 8th, the patient underwent right hip hemiarthroplasty by Dr. Rudene Christians. Assistant was Dorthula Matas, PA-C. Anesthesia was spinal. Estimated blood loss was 250 mL. SPECIMEN: A femoral head specimen was sent. Operative findings included a completely displaced femoral head but no degenerative joint disease. Implants were by [employer]ker with bipolar head. A Hemovac drain was placed. No complications had occurred.   HISTORY: Mr. Guzzetta is an 81 year old who presented to the hospital after suffering a fall and was noted to have a right hip proximal femur fracture. Hospitalist Services were contacted for medical management and evaluation. The patient denied any prodromal symptoms prior to the fall. He states that he was getting out of the shower and slipped and fell. His wife brought him to the ER, and he was noted to have hip fracture on x-ray.   PAST MEDICAL HISTORY:  1. Hypertension.  2. Hyperlipidemia.  3. History of Barrett's esophagus. 4. Diet controlled diabetes.  5. Gastroesophageal reflux disease. 6. History of deep vein thrombosis, he is on Coumadin. 7. Chronic kidney disease, stage III.  8. History of GI bleed secondary to AV malformations. 9. History of aortic valve replacement, porcine valve in 2007.   PHYSICAL EXAMINATION:  GENERAL: A pleasant-appearing male in no  apparent distress.   HEART: Regular rate and rhythm. He does have a 2/6 systolic ejection murmur heard at the right sternal border.   LUNGS: Clear to auscultation anteriorly.   EXTREMITIES: No evidence of cyanosis, clubbing, or peripheral edema. His right lower extremity is in traction secondary to his fracture.   HOSPITAL COURSE: The patient was admitted on February 7th and found to be low to moderate cardiac risk for surgery. Largely his medications were simply continued. He would be n.p.o. on February 8th and undergo right hip hemiarthroplasty without complication and was transferred to the postanesthesia care unit and then the Orthopedic floor in stable condition. After surgery, he was treated with Lovenox and Coumadin as well as TED hose for deep vein thrombosis prophylaxis. His INR would become 1.8 over the weekend and then 1.6 on February 11th. He would continue on Coumadin but Lovenox was stopped. He underwent right hip dressing change on postoperative day two. He had a good deal of ecchymosis about his hip but no sign of infection. Staples were intact. The Hemovac drain was removed. His Foley catheter was removed. His pain was largely under control while he was here. His creatinine was 2.33 on February 10th. His platelets were 95 on 10th. Hemoglobin was 10.4. He did not have any transfusion while here. His oxygen saturations are in the low to mid 90s on room air. He had tolerated his diet well while here. He did pass some stool. He did work with physical therapy on multiple occasions while here. He had ambulated 15 feet on February 10th. Of note, he had had a previous left knee partial replacement medial compartment with Dr. Rudene Christians.  CONDITION AT DISCHARGE: Stable.   DISPOSITION: [facility].    DISCHARGE MEDICATIONS:  1. Coumadin 3 mg per oral every day at 5:00. 2. Norvasc 2.5 mg oral daily. 3. Losartan 100 mg oral daily. 4. Prilosec 20 mg oral twice a day.  5. Antacid DS suspension  30 mL every 6 hours as needed for heartburn.  6. Dulcolax 10 mg rectal daily as needed for constipation.  7. Milk of magnesia 30 mL oral twice a day p.r.n. constipation.  8. Senokot-S 1 tablet oral twice a day.  9. Oxycodone 5 to 10 mg every 4 hours as needed for pain.  10. Tylenol 500 to 1000 mg oral every 6 hours as needed for pain or elevated temperature.   DISCHARGE INSTRUCTIONS AND FOLLOWUP:    1. Apply exit alarm. 2. Universal skin precautions.  3. Physical Therapy and Occupational Therapy consult. The patient is weight-bearing as tolerated on the right leg; however, posterior hip precautions are in place. He is not to flex his hip more than 70 degrees. He is to avoid hip adduction and internal rotation. He is to have a pillow between his legs when in bed.  4. Encourage incentive spirometry while awake.  5. TED hose knee-high are to be worn daily for 6 weeks status post surgery date.  6. Sterile dressing changes to the right hip as needed for drainage.  7. Soapsuds enema as needed.  8. Carbohydrate-controlled diet, 1800 calorie.  9. Please continue to check the patient's PT and INR, shooting for an INR between 2 and 3.  10. The patient is to follow up at [facility] on February 25th at 9:30 a.m. with Dorthula Matas, PA-C for staple removal. If there are any questions, please call our office.   ____________________________ Jerrel Ivory. Dejour Vos, [place] jrp:cbb D: 01/31/2012 15:58:54 ET T: 01/31/2012 16:23:23 ET JOB#: [id number]  cc: Jerrel Ivory. Charlett Nose, [place], <Dictator> [place] PA ELECTRONICALLY SIGNED 02/01/2012 12:47

## 2015-04-13 NOTE — Consult Note (Signed)
Brief Consult Note: Diagnosis: 1. Pre-operative evaluation 2. Right HIp Fracture 3. HTN 4. Hyperlipidemia 5. hx of Aortic Valve replacement. 6. hx of GI Bleed. 7. CRI.   Patient was seen by consultant.   Consult note dictated.   Recommend to proceed with surgery or procedure.   Orders entered.   Comments: 81 yo male w/ hx of HTN, hyperlipidemia, hx of CRI, Diet controled DM, GERD, hx of aortic valve replacement came into hospital after a fall and noted to have right hip fracture.   1. Pre-operative evaluation - pt. is low to moderate risk for non-cardiac surgery.  NO contraindications to surgery at this time.  Will review ECG.  2. HTN - cont. Cozaar, Norvasc 3. GERD - cont. Prilosec 4. CRI - Cr. at baseline and will monitor.  Renal dose meds and avoid nephrotoxins.  5. Depression - cont. Celexa.   Thanks for the consult and will follow with you Job # [id number]  Electronic Signatures: Henreitta Leber (MD)  (Signed 07-Feb-13 21:45)  Authored: Brief Consult Note   Last Updated: 07-Feb-13 21:45 by Henreitta Leber (MD)

## 2015-04-13 NOTE — Op Note (Signed)
PATIENT NAME:  Philip Ray, Philip Ray MR#:  [id number] DATE OF BIRTH:  February 13, 1926  DATE OF PROCEDURE:  01/28/2012  PREOPERATIVE DIAGNOSIS: Right femoral neck fracture, displaced.   POSTOPERATIVE DIAGNOSIS: Right femoral neck fracture, displaced.  PROCEDURE: Right hip hemiarthroplasty.   SURGEON: Mertie Moores, MD   ASSISTANT: Dorthula Matas, PA-C    ANESTHESIA: Spinal.   DESCRIPTION OF PROCEDURE: The patient was brought to the operating room and after adequate anesthesia was obtained the patient was placed in a lateral decubitus position using the pegboard hip positioner. After prepping and draping the hip in a sterile fashion, appropriate patient identification and time-out procedures were carried out. A posterior approach was made to the hip going through the skin over the center of the greater trochanter. There was extensive ecchymosis present within the soft tissues consistent with capsular penetration of the fracture and extravasation of hematoma from the hip fracture site. The iliotibial band and gluteal fascia were then incised in the direction of fibers and a deep Charnley retractor placed. With internal rotation, the fracture site could be exposed and the short external rotators detached off the greater trochanter. T capsulotomy was then carried out and the femoral neck cut made a centimeter above the lesser trochanter. The femoral head was then removed. There was no evidence of osteoarthritis in the head or acetabulum. The head measured between 51 and 52 mm and 52 mm gave a very good fit and was chosen as the bipolar component size. The femoral stem was then sequentially broached up to a #8 press-fit cement and cement restrictor size #3. The three cement restrictor was placed, the canal irrigated and dried and cement inserted down the canal with a size 7 stem inserted. After the cement had set and excess cement was removed, a +0 26 mm head was attached followed by the 52 mm bipolar  component head. The hip was reduced and stable through a range of motion. Posterior capsule was repaired using an 0 Ethibond. The gluteal fascia was then repaired in a running fashion with a heavy quill, 2-0 Vicryl subcutaneously and skin staples. Xeroform, 4 x 4's, ABD, and tape was applied along with an abduction pillow. The patient was sent to the recovery room in stable condition.   ESTIMATED BLOOD LOSS: 250 mL.   COMPLICATIONS: None.   SPECIMENS: Femoral head.  IMPLANTS: Stryker UHR universal head bipolar component 52 mm outer diameter, 26 mm internal diameter, ODC fracture hip stem size 7 C taper, C-taper 26 mm +0 head and a size 3 cement plug, standard Simplex bone cement applied.   ____________________________ Laurene Footman, MD mjm:drc D: 01/28/2012 19:56:13 ET T: 01/29/2012 09:44:44 ET JOB#: [id number]  cc: Laurene Footman, MD, <Dictator> Laurene Footman MD ELECTRONICALLY SIGNED 01/29/2012 11:16

## 2015-04-13 NOTE — Consult Note (Signed)
PATIENT NAME:  Philip Ray, Philip Ray MR#:  [id number] DATE OF BIRTH:  May 31, 1926  DATE OF CONSULTATION:  03/02/2012  REFERRING PHYSICIAN:  Laurice Record. Holley Bouche., MD  CONSULTING PHYSICIAN:  Cherre Huger, MD PRIMARY CARE PHYSICIAN: Apolonio Schneiders, MD   REASON FOR CONSULTATION: Preoperative evaluation and management.   HISTORY OF PRESENT ILLNESS: The patient is an 81 year old Caucasian male with a past medical history of hypertension, uncontrolled diabetes, hyperlipidemia, gastroesophageal reflux disease, Barrett's esophagus, who was recently in the hospital in February with a right hip fracture. He underwent open reduction internal fixation and was discharged to [facility] for rehab. The patient fell at [facility] and returns today with left hip femoral fracture. He is being admitted to the Orthopedic Service. Medicine was consulted for preoperative evaluation and management. The patient is oriented only to self and place. He has very poor short-term memory. He denies any pain. He does not know how he got hurt. As per the operating physician, he fell out of bed. The patient denies any chest pain, shortness of breath, any abdominal pain, nausea, vomiting or  pain.   PAST MEDICAL HISTORY:  1. Hypertension.  2. Diet-controlled diabetes. 3. Hyperlipidemia. 4. Depression.  5. Gastroesophageal reflux disease.  6. Benign prostatic hypertrophy.  7. History of Barrett's esophagus.  8. History of left lower extremity DVT  about six years, on Coumadin. 9. Chronic kidney disease, stage III.  10. History of GI bleed secondary to a arteriovenous malformation.  11. History of aortic valve replacement in 2007. It was a porcine valve.   PAST SURGICAL HISTORY:  1. Right hemicolectomy.  2. Transurethral resection of the prostate.  3. Esophagogastrectomy. 4. Lumbar laminectomy. 5. Aortic valve replacement.  6. Appendectomy.    SOCIAL HISTORY: There is no history of smoking, alcohol or drug abuse. The patient is  currently living [facility].   FAMILY HISTORY: Mother died of cancer of unknown type. The patient cannot recall his father's medical history.  CURRENT MEDICATIONS:  1. Ketoconazole 2% cream applied to back b.i.d.  2. Zeasorb powder 1 to 2 daily.  3. Soapsuds enema daily as needed for constipation.  4. Vitamin D 1000 international units b.i.d.   5. Citalopram 20 mg daily.  6. Amlodipine 2.5 mg daily.  7. Jantoven 5 mg daily.  8. Losartan 100 mg daily.  9. Omeprazole 20 mg daily.  10. Senna Plus 1 tablet b.i.d.  11. Finasteride 5 mg daily.  12. Coenzyme Q10 100 mg daily.  13. Omega-3 fish oil daily. 14. Calcium citrate with magnesium daily.  15. Multivitamin daily. 16. Milk of magnesia p.r.n.   17. Oxycodone 1 to 2 tablets every 4 hours p.r.n.  18. Tylenol p.r.n.   REVIEW OF SYSTEMS: CONSTITUTIONAL:  The patient denies any fever or fatigue. EYES: Denies any blurred or double vision. ENT: Denies any tinnitus or ear pain. RESPIRATORY: Denies any cough, wheezing. CARDIOVASCULAR: Denies any chest pain, palpitations. GI: Denies any nausea or vomiting. GU: Denies any dysuria or hematuria. ENDOCRINE: Denies any polyuria, nocturia. HEMATOLOGIC/LYMPHATIC: Denies any anemia or easy bruisability. INTEGUMENTARY: Denies any acne, rash. MUSCULOSKELETAL: Denies any swelling or gout. NEUROLOGICAL: Denies any numbness or weakness. PSYCHIATRIC: Denies any anxiety, has history of depression.    PHYSICAL EXAMINATION:  VITAL SIGNS: Temperature 98.1, heart rate 95, respiratory rate 18, blood pressure 148/78, pulse oximetry 94% on room air.   GENERAL: The patient is an elderly Caucasian male lying comfortably in bed, not in acute distress.   HEENT: Head  atraumatic, normocephalic. Eyes: There is some pallor. No icterus or cyanosis. Pupils are equal, round and reactive to light and accommodation. Extraocular movements are intact. ENT: Wet mucous membranes. No oropharyngeal  erythema or thrush.   NECK: Supple. No masses. No JVD. No thyromegaly, no lymphadenopathy.   CHEST WALL: No tenderness to palpation. Not using accessory muscles of respiration. No intercostal muscle retractions.   LUNGS: Bilaterally clear to auscultation. No wheezing, rales, or rhonchi.   CARDIOVASCULAR: S1 and S2 regular. There is a systolic murmur. No rubs or gallops.   ABDOMEN: Soft, nontender, nondistended. No guarding, no rigidity. No organomegaly. Normal bowel sounds.   SKIN: No rashes or lesions.   PERIPHERIES: No pedal edema. 1+ pedal pulses.   MUSCULOSKELETAL: No cyanosis or clubbing.   NEUROLOGICAL: Awake and alert. The patient is oriented to self and place. He is otherwise confused. Cranial nerves are grossly intact. Motor strength is five out of five in upper extremities. Sensory is grossly intact.   PSYCHIATRIC: Normal mood and affect.   LABORATORY, DIAGNOSTIC AND RADIOLOGICAL DATA:  White count 11.9, hemoglobin 12.5, hematocrit 38.2, platelets 233. Glucose 163, BUN 42, creatinine 2.26.  Electrolytes are normal.  INR 1.9.  Left hip x-ray shows fracture.  Chest x-ray shows increased perihilar markings which have a nodular appearance which are present and are not entirely new. There is nodularity in the right upper lobe which does appear more conspicuous than on the earlier study.   ASSESSMENT AND PLAN: The patient is an 81 year old male with past medical history of hypertension, diabetes, hyperlipidemia, Barrett's esophagus, presents with left hip fracture status post fall. Medicine was consulted for preoperative evaluation.   1. Preoperative medical evaluation: The patient is currently receiving rehab at [facility] after right hip arthroplasty. I do not believe that he is very physically active; however, he has tolerated surgery and anesthesia for his right hip arthroplasty without any difficulty. He is at moderate risk for further surgery and anesthesia, but there is no  contraindication to proceeding with surgery.  2. Hypertension: His blood pressures are well controlled. We will continue Norvasc and losartan and add low dose preoperative beta blocker with holding parameters.   3. Hyperlipidemia: We will continue fish oil.  4. Gastroesophageal reflux disease: We will continue protein pump inhibitor.   5. History of deep venous thrombosis: The patient had a deep venous thrombosis more than six years ago. There is no history of PE. He has been adequately treated for his PE. We will hold his anticoagulation. His INR is 1.9. We will give one dose of vitamin K and recheck a PT-INR in the a.m.  6. History of benign prostatic hypertrophy: We will continue Proscar. 7. Chronic kidney disease: Appears to be at baseline.  8. Depression: We will continue citalopram.  9. Vitamin D deficiency: We will continue oral vitamin D supplementation.  10. History of aortic valve replacement with porcine valve: The patient does not require anticoagulation since the valve is a tissue valve.   I have reviewed old medical records, discussed with Dr. Marry Guan.    TIME SPENT: 75 minutes  ____________________________ Cherre Huger, MD sp:cbb D: 03/02/2012 21:53:54 ET T: 03/03/2012 10:04:03 ET JOB#: [id number] cc: Vianne Bulls. Arline Asp, MD Cherre Huger MD ELECTRONICALLY SIGNED 03/03/2012 17:34

## 2016-03-02 IMAGING — CR DG CHEST 2V
1 series · 2 of 2 positions shown · non-contrast
Comparison: 03/02/2012

CLINICAL DATA: Diarrhea 1 week ago. Decreased appetite. Weakness.
History of esophageal cancer, hypertension, reflux, anemia.

EXAM:
CHEST  2 VIEW

[Series 1: dxr chest pa (or ap) and lateral · 0.14mm/px · 2 of 2 slices shown]
[im 1/2]
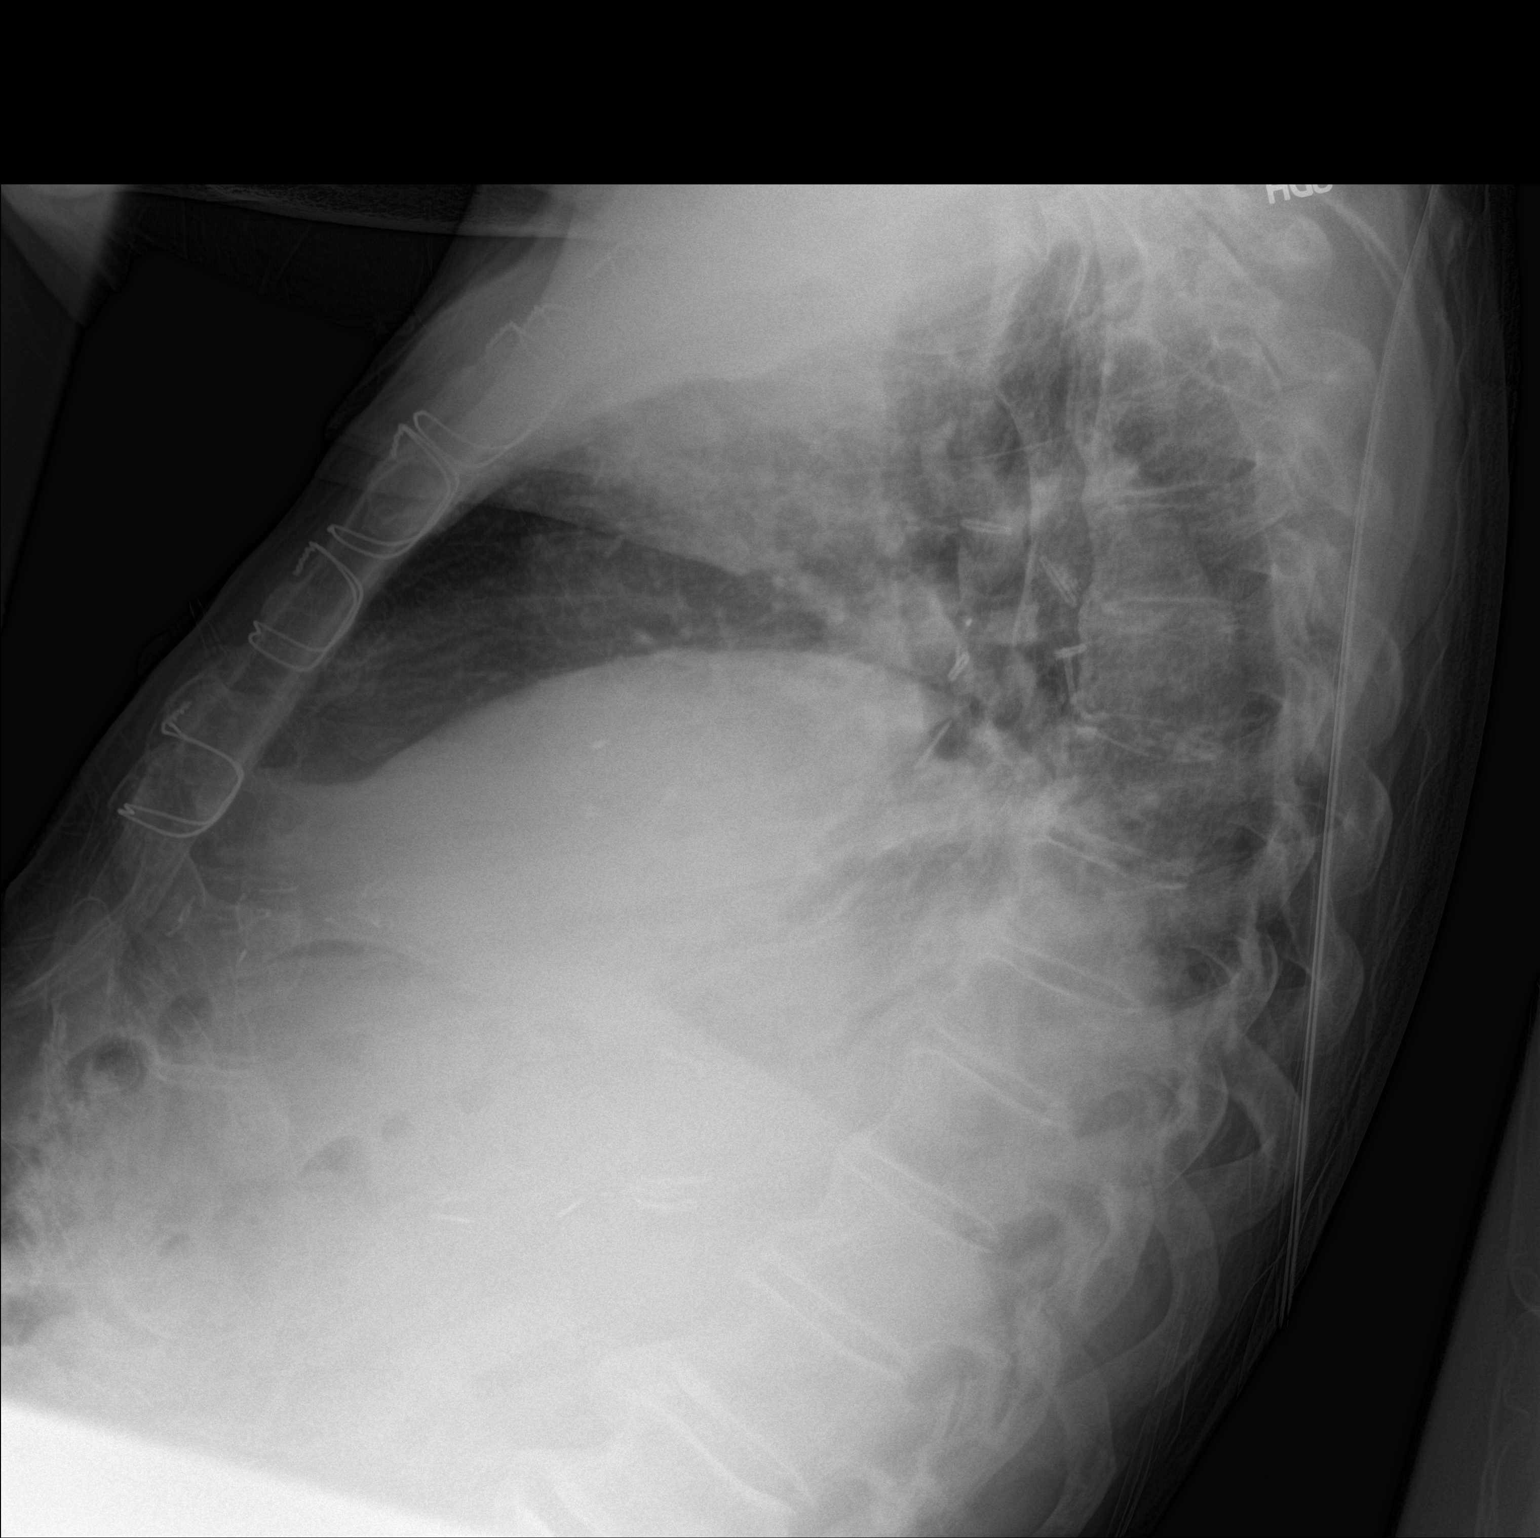
[im 2/2]
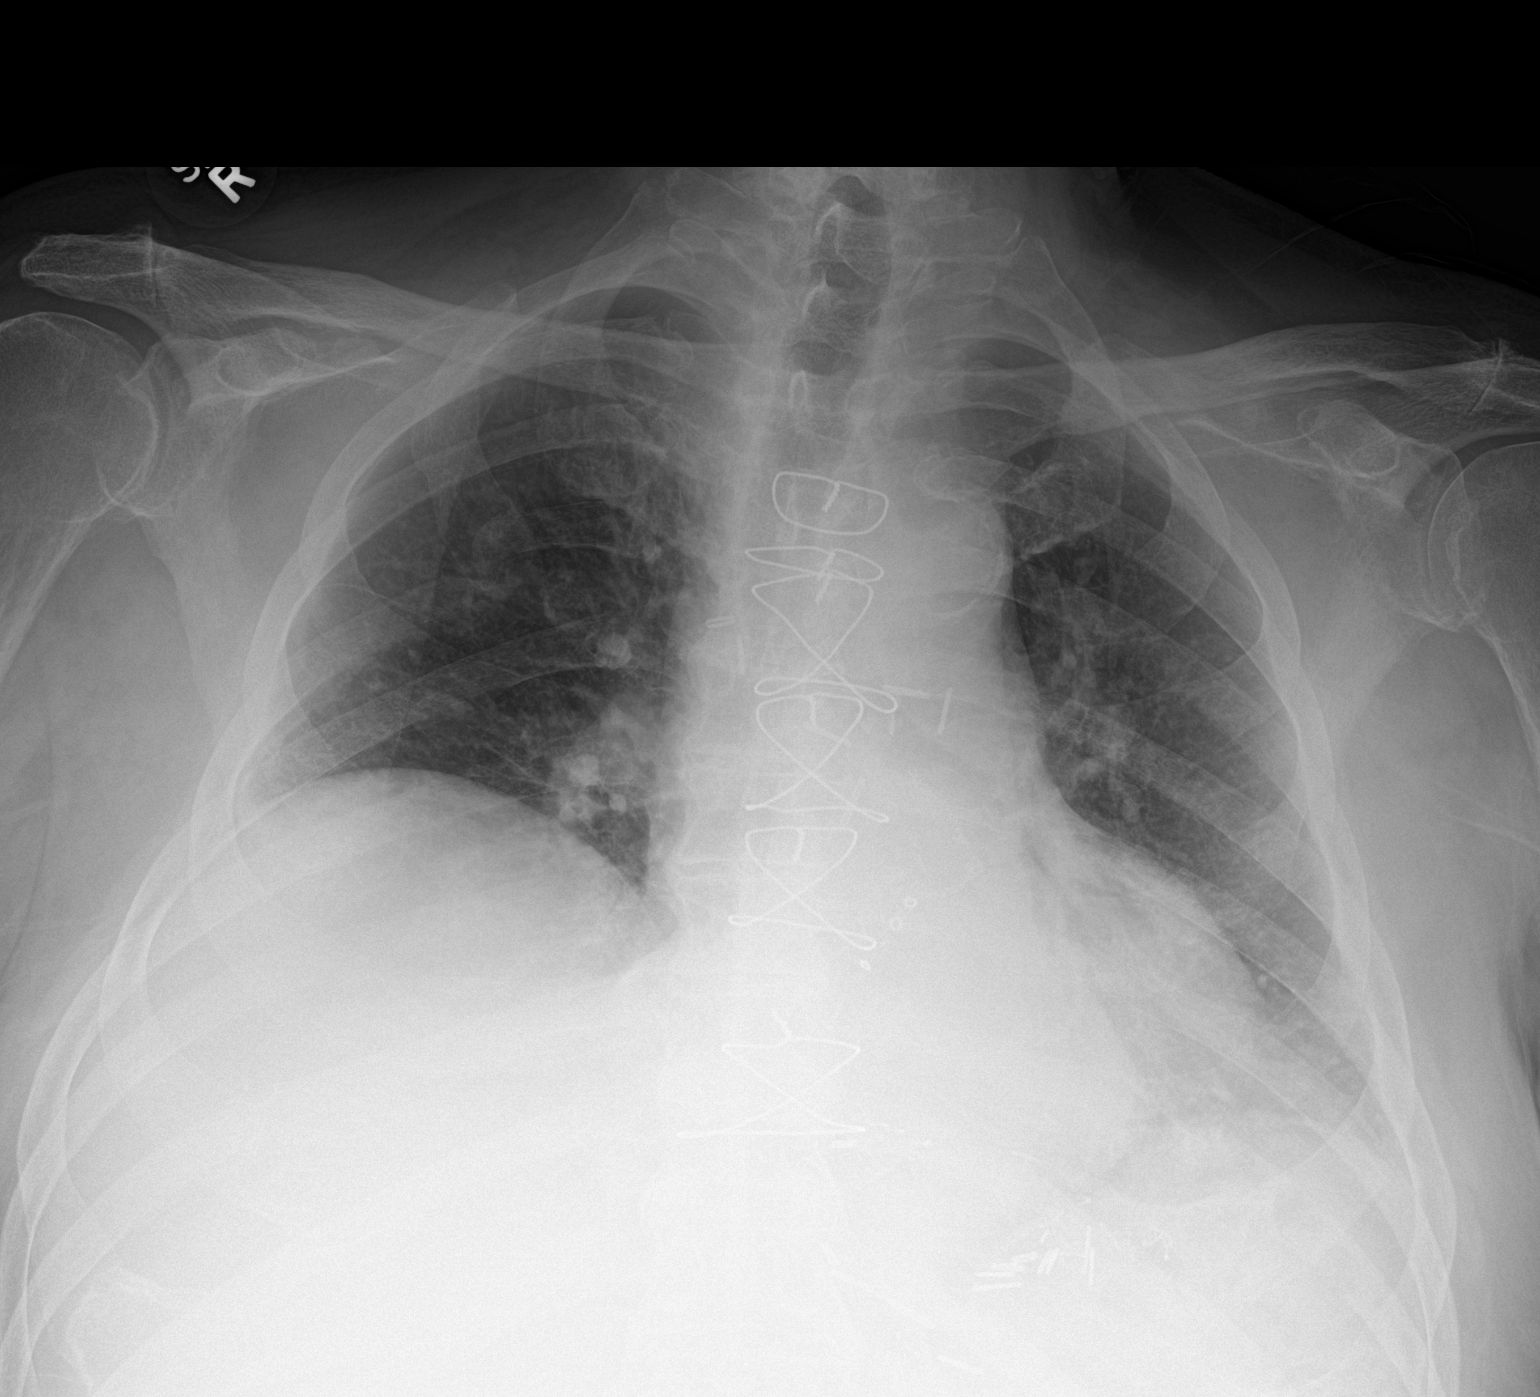

[2 of 2 positions shown; findings below may reference images not displayed]

FINDINGS: Prior CABG. Elevation of the right hemidiaphragm. Low lung volumes.
No confluent airspace opacities or effusions. No acute bony
abnormality.
IMPRESSION: Stable elevation of the right hemidiaphragm. No active
cardiopulmonary disease.

## 2016-03-02 IMAGING — CT CT ABD-PELV W/O CM
2 of 4 series · 14 of 46 positions shown, 16 images · non-contrast
Comparison: 01/01/2011

CLINICAL DATA: Diarrhea 1 week ago. Decreased appetite. Increased
weakness. Dementia.

EXAM:
CT ABDOMEN AND PELVIS WITHOUT CONTRAST
TECHNIQUE: Multidetector CT imaging of the abdomen and pelvis was performed
following the standard protocol without IV contrast.

[Series 2: routine abd pel without · axial · non-contrast · 0.76mm/px · z∈[+126,+580]mm · 11 of 106 slices shown, 13 images]
[im 10/106  soft-tissue]
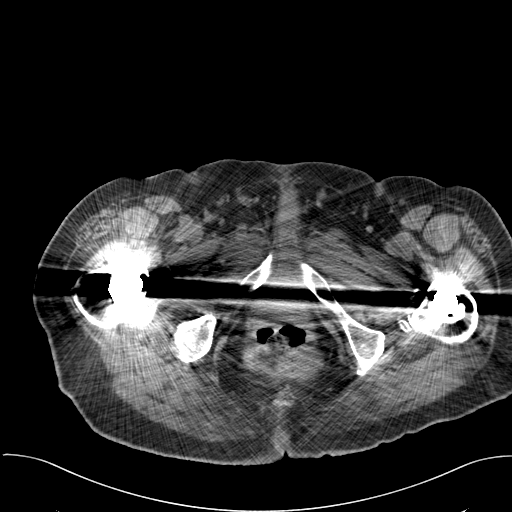
[im 10/106  bone]
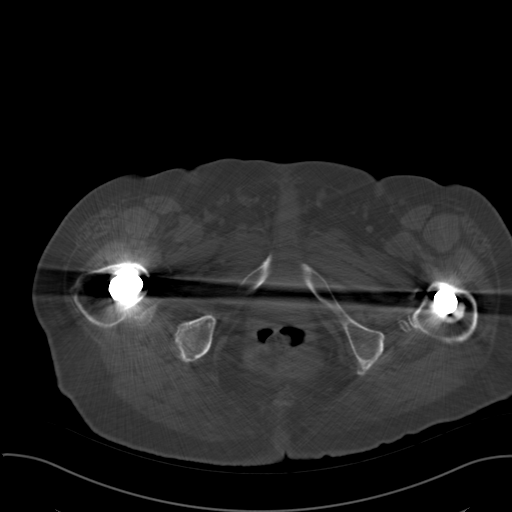
[im 19/106  soft-tissue]
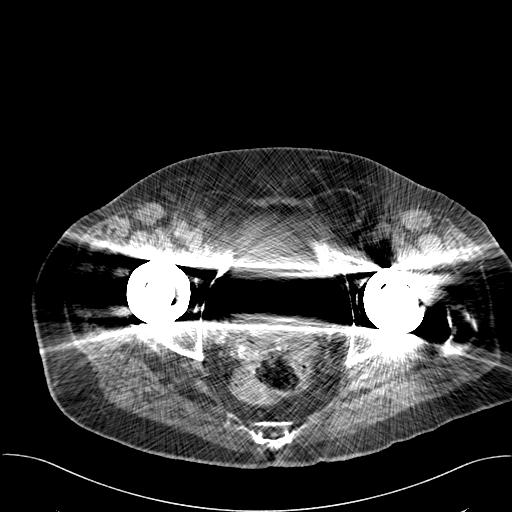
[im 28/106  soft-tissue]
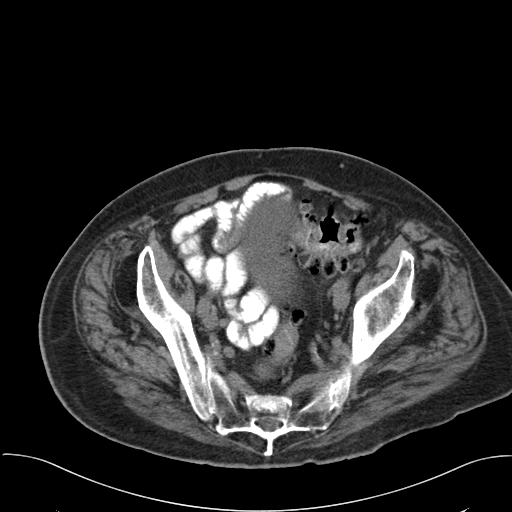
[im 37/106  soft-tissue]
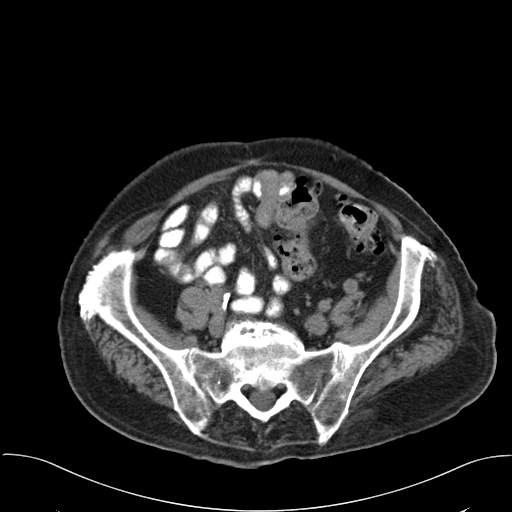
[im 46/106  soft-tissue]
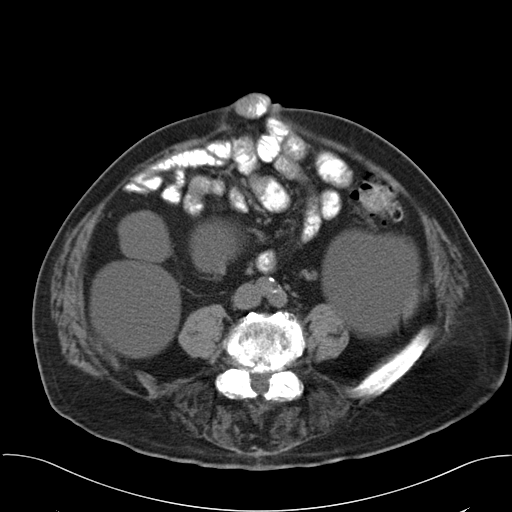
[im 55/106  soft-tissue]
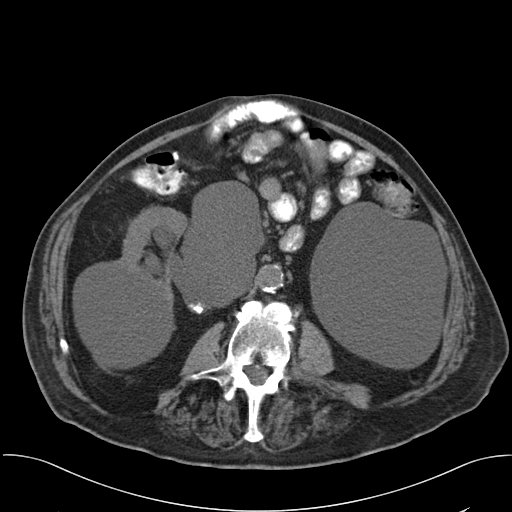
[im 64/106  soft-tissue]
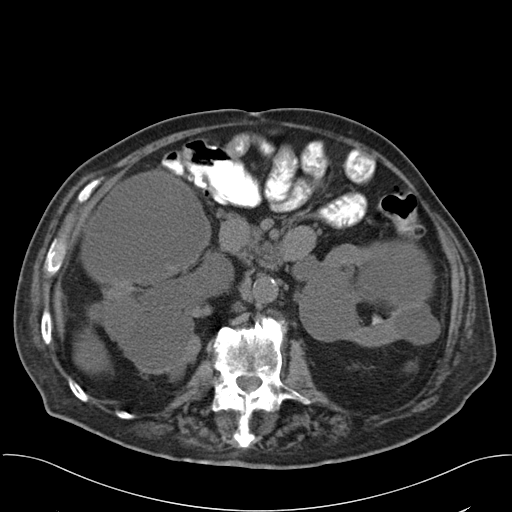
[im 74/106  soft-tissue]
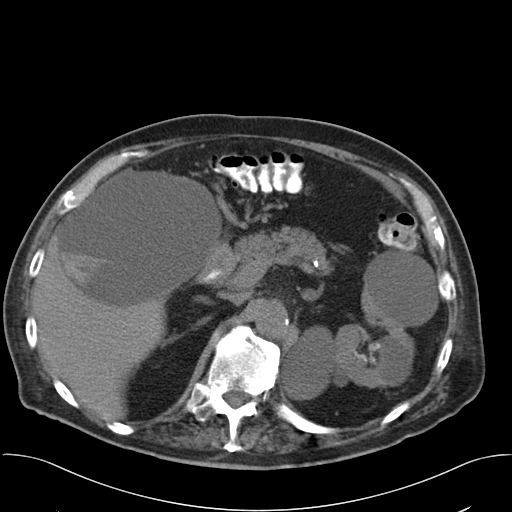
[im 83/106  soft-tissue]
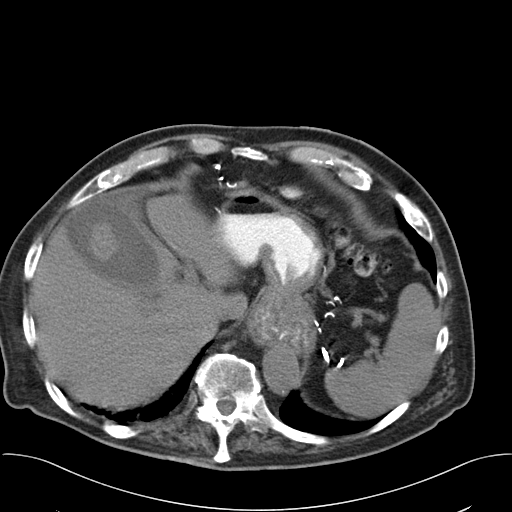
[im 83/106  bone]
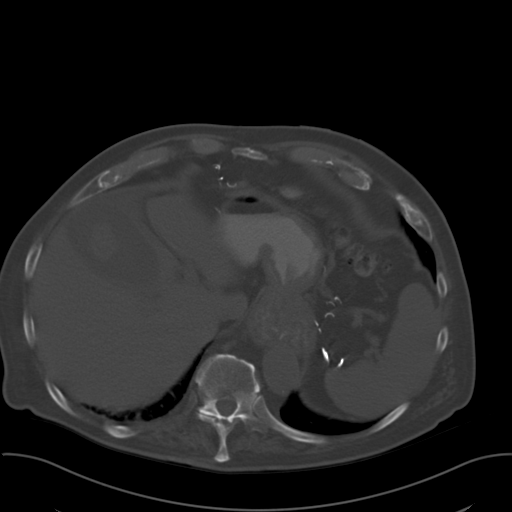
[im 92/106  soft-tissue]
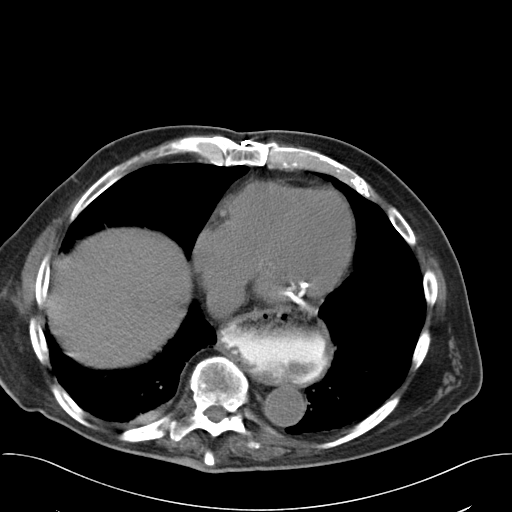
[im 101/106  soft-tissue]
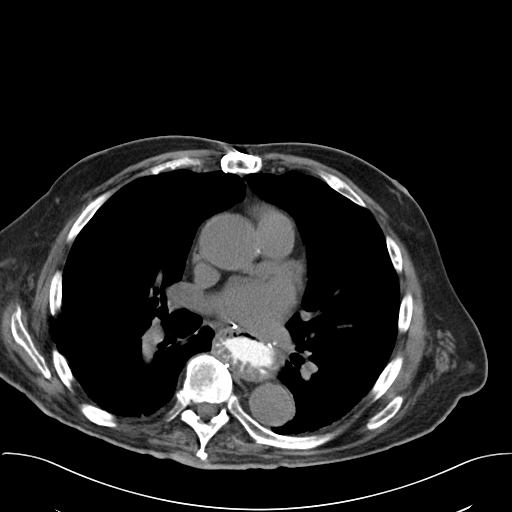

[Series 5: cor routine abd pel wo · coronal · 0.78mm/px · 3 of 147 slices shown]
[im 49/147  soft-tissue]
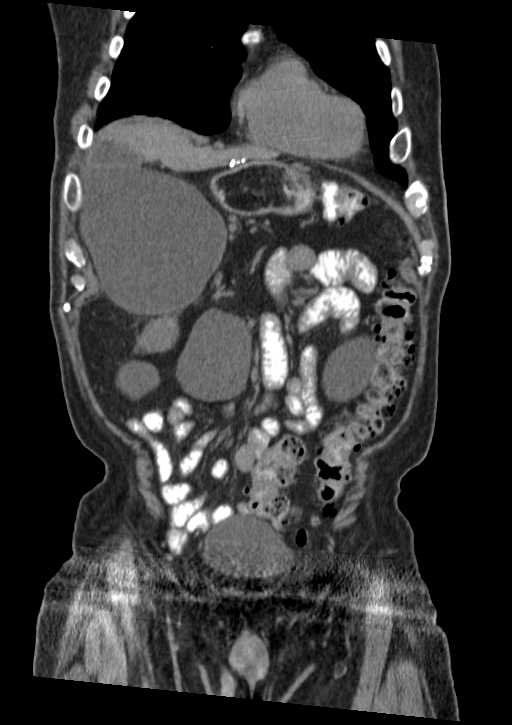
[im 65/147  soft-tissue]
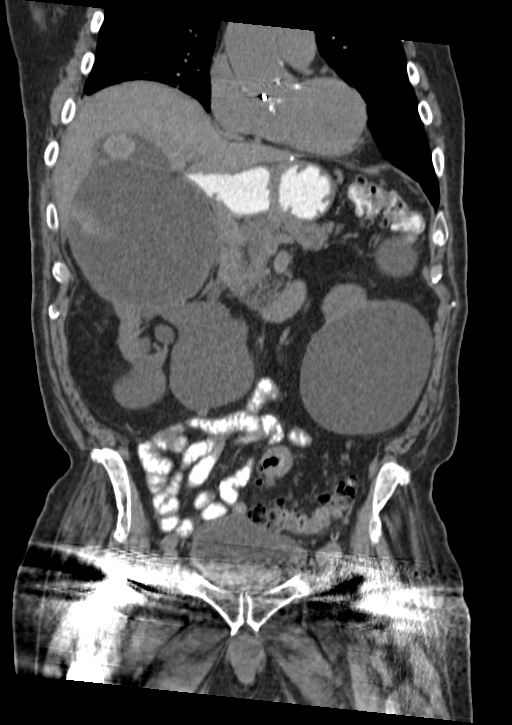
[im 82/147  soft-tissue]
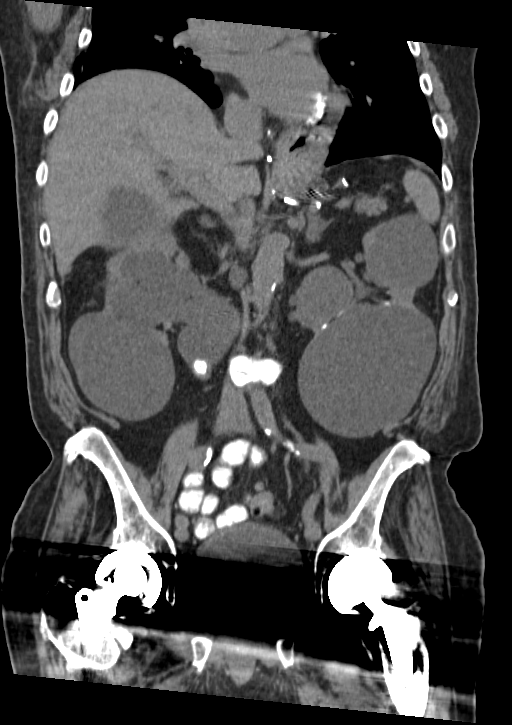

[14 of 46 positions shown; findings below may reference images not displayed]

FINDINGS: Lower chest: Clear lung bases. Mild cardiomegaly with prior median
sternotomy. A moderate hiatal hernia.

Hepatobiliary: Normal noncontrast appearance of the liver. Gallstone
without gross biliary ductal dilatation.

Pancreas: Pancreas unremarkable, without duct dilatation.

Spleen: Normal

Adrenals/Urinary Tract: Mild left adrenal nodularity. Normal right
adrenal gland. Multiple fluid density left renal lesions which are
likely cysts. Left renal collecting system calculi. No abdominal
hydroureter.

A dominant fluid density lesion in the right upper quadrant measures
12.7 x 10.1 cm on image 34 series 2. This is either new or enlarged
since the prior exam. Has soft tissue density within peripheral
portion. This is favored to arise from the anterior aspect the right
kidney.

There is marked right renal cortical thinning without other fluid
density renal lesions which are likely cysts. concurrent chronic
caliectasis and pelviectasis. Calcification medially likely within a
dilated right extrarenal pelvis. These measure up to 11 mm on image
56. No hydroureter identified. Moderate degraded evaluation of
pelvis secondary to beam hardening artifact from bilateral hip
arthroplasties. Urinary bladder wall is likely thickened, eccentric
right.

Stomach/Bowel: Normal distal stomach. Extensive colonic
diverticulosis with muscular hypertrophy involving the sigmoid.
Surgical changes in the right side of the colon . Small bowel
caliber is normal. There is ventral abdominal wall laxity containing
nonobstructive small bowel. There may be a concurrent hernia. This
is not significantly changed.

Vascular/Lymphatic: Aortic and branch vessel atherosclerosis.
Suspect isolated enlarged retro caval node at 1.3 cm on image 41.
This is unchanged, suggesting a reactive etiology.

No gross pelvic adenopathy.

Reproductive: Limited evaluation of the prostate.

Other:   Bilateral fat containing inguinal hernias.

Musculoskeletal: Bilateral hip arthroplasties.  Osteopenia.
IMPRESSION: 1. Degraded evaluation of the pelvis, secondary to beam hardening
artifact from bilateral hip replacements.
2. Abnormal appearance of the kidneys, favored to primarily be due
to multiple renal cysts and collecting system calculi. On the right,
stones are felt to be positioned within an dilated right extrarenal
pelvis with chronic pelvic caliectasis. The right ureter is
difficult to follow, but not significantly dilated.
3. Right upper quadrant mass is favored to arise from the right
kidney. This could represent a complex cyst. A solid neoplasm cannot
be entirely excluded. If further imaging characterization is
desired, contrast-enhanced CT or nonemergent pre and post contrast
abdominal MRI could be performed. Given patient age and
comorbidities, this is of questionable significance.
4. Cholelithiasis.
5. Moderate hiatal hernia.
6. Small bowel within ventral abdominal wall laxity and hernia.
7. No evidence of bowel obstruction or other acute complicating
feature.
8. Asymmetric bladder wall thickening, possibly related to bladder
outlet obstruction.

## 2020-07-08 NOTE — Progress Notes (Signed)
This encounter was created in error - please disregard.
